# Patient Record
Sex: Male | Born: 1975
Health system: Southern US, Community
[De-identification: ages and names within clinical notes are randomized; demographics above are authoritative.]

## PROBLEM LIST (undated history)

## (undated) DIAGNOSIS — N200 Calculus of kidney: Secondary | ICD-10-CM

## (undated) DIAGNOSIS — E785 Hyperlipidemia, unspecified: Secondary | ICD-10-CM

## (undated) DIAGNOSIS — Z720 Tobacco use: Secondary | ICD-10-CM

## (undated) DIAGNOSIS — E118 Type 2 diabetes mellitus with unspecified complications: Secondary | ICD-10-CM

## (undated) DIAGNOSIS — I25110 Atherosclerotic heart disease of native coronary artery with unstable angina pectoris: Secondary | ICD-10-CM

## (undated) DIAGNOSIS — I2119 ST elevation (STEMI) myocardial infarction involving other coronary artery of inferior wall: Secondary | ICD-10-CM

## (undated) DIAGNOSIS — E1169 Type 2 diabetes mellitus with other specified complication: Secondary | ICD-10-CM

## (undated) HISTORY — DX: Calculus of kidney: N20.0

## (undated) HISTORY — DX: Hyperlipidemia, unspecified: E78.5

## (undated) HISTORY — DX: Morbid (severe) obesity due to excess calories: E66.01

## (undated) HISTORY — PX: WRIST SURGERY: SHX841

## (undated) HISTORY — DX: Type 2 diabetes mellitus with unspecified complications: E11.8

## (undated) HISTORY — DX: Hyperlipidemia, unspecified: E11.69

---
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
## 2001-01-17 ENCOUNTER — Emergency Department (HOSPITAL_COMMUNITY): Admission: EM | Admit: 2001-01-17 | Discharge: 2001-01-17 | Payer: Self-pay

## 2008-09-22 ENCOUNTER — Encounter: Admission: RE | Admit: 2008-09-22 | Discharge: 2008-09-22 | Payer: Self-pay | Admitting: Orthopedic Surgery

## 2010-02-06 ENCOUNTER — Emergency Department (HOSPITAL_COMMUNITY): Admission: EM | Admit: 2010-02-06 | Discharge: 2010-02-06 | Payer: Self-pay | Admitting: Emergency Medicine

## 2010-05-12 ENCOUNTER — Emergency Department (HOSPITAL_COMMUNITY)
Admission: EM | Admit: 2010-05-12 | Discharge: 2010-05-12 | Payer: Self-pay | Source: Home / Self Care | Admitting: Emergency Medicine

## 2010-08-06 ENCOUNTER — Inpatient Hospital Stay (HOSPITAL_COMMUNITY)
Admission: EM | Admit: 2010-08-06 | Discharge: 2010-08-10 | Payer: Self-pay | Source: Home / Self Care | Attending: Internal Medicine | Admitting: Internal Medicine

## 2010-08-27 ENCOUNTER — Emergency Department (HOSPITAL_COMMUNITY)
Admission: EM | Admit: 2010-08-27 | Discharge: 2010-08-27 | Payer: Self-pay | Source: Home / Self Care | Admitting: Emergency Medicine

## 2010-11-04 LAB — BASIC METABOLIC PANEL
BUN: 11 mg/dL (ref 6–23)
CO2: 30 mEq/L (ref 19–32)
Calcium: 9 mg/dL (ref 8.4–10.5)
Chloride: 103 mEq/L (ref 96–112)
Creatinine, Ser: 0.93 mg/dL (ref 0.4–1.5)
GFR calc Af Amer: >60 mL/min (ref >60)
GFR calc non Af Amer: >60 mL/min (ref >60)
Glucose, Bld: 97 mg/dL (ref 70–99)
Potassium: 4.5 mEq/L (ref 3.5–5.1)
Sodium: 138 mEq/L (ref 135–145)

## 2010-11-04 LAB — DIFFERENTIAL
Basophils Absolute: 0 10*3/uL (ref 0.0–0.1)
Basophils Relative: 0 % (ref 0–1)
Eosinophils Absolute: 0.1 10*3/uL (ref 0.0–0.7)
Eosinophils Relative: 1 % (ref 0–5)
Lymphocytes Relative: 14 % (ref 12–46)
Lymphs Abs: 1.7 10*3/uL (ref 0.7–4.0)
Monocytes Absolute: 0.9 10*3/uL (ref 0.1–1.0)
Monocytes Relative: 8 % (ref 3–12)
Neutro Abs: 9.4 10*3/uL — ABNORMAL HIGH (ref 1.7–7.7)
Neutrophils Relative %: 78 % — ABNORMAL HIGH (ref 43–77)

## 2010-11-04 LAB — CBC
HCT: 45 % (ref 39.0–52.0)
HCT: 49.7 % (ref 39.0–52.0)
Hemoglobin: 15.1 g/dL (ref 13.0–17.0)
Hemoglobin: 17.4 g/dL — ABNORMAL HIGH (ref 13.0–17.0)
MCH: 31.5 pg (ref 26.0–34.0)
MCH: 32.2 pg (ref 26.0–34.0)
MCHC: 33.6 g/dL (ref 30.0–36.0)
MCHC: 35 g/dL (ref 30.0–36.0)
MCV: 91.9 fL (ref 78.0–100.0)
MCV: 93.9 fL (ref 78.0–100.0)
Platelets: 210 10*3/uL (ref 150–400)
Platelets: 216 10*3/uL (ref 150–400)
RBC: 4.79 MIL/uL (ref 4.22–5.81)
RBC: 5.41 MIL/uL (ref 4.22–5.81)
RDW: 12.3 % (ref 11.5–15.5)
RDW: 12.4 % (ref 11.5–15.5)
WBC: 12.1 10*3/uL — ABNORMAL HIGH (ref 4.0–10.5)
WBC: 9.7 10*3/uL (ref 4.0–10.5)

## 2010-11-04 LAB — POCT I-STAT, CHEM 8
BUN: 9 mg/dL (ref 6–23)
Calcium, Ion: 1.17 mmol/L (ref 1.12–1.32)
Chloride: 104 mEq/L (ref 96–112)
Creatinine, Ser: 0.9 mg/dL (ref 0.4–1.5)
Glucose, Bld: 105 mg/dL — ABNORMAL HIGH (ref 70–99)
HCT: 51 % (ref 39.0–52.0)
Hemoglobin: 17.3 g/dL — ABNORMAL HIGH (ref 13.0–17.0)
Potassium: 4.1 mEq/L (ref 3.5–5.1)
Sodium: 140 mEq/L (ref 135–145)
TCO2: 28 mmol/L (ref 0–100)

## 2010-11-04 LAB — GC/CHLAMYDIA PROBE AMP, GENITAL
Chlamydia, DNA Probe: NEGATIVE
GC Probe Amp, Genital: NEGATIVE

## 2012-05-27 ENCOUNTER — Emergency Department (HOSPITAL_COMMUNITY)
Admission: EM | Admit: 2012-05-27 | Discharge: 2012-05-27 | Disposition: A | Discharge status: Home / Self Care | Payer: Self-pay | Attending: Emergency Medicine | Admitting: Emergency Medicine

## 2012-05-27 ENCOUNTER — Encounter (HOSPITAL_COMMUNITY): Payer: Self-pay | Admitting: Emergency Medicine

## 2012-05-27 DIAGNOSIS — F17200 Nicotine dependence, unspecified, uncomplicated: Secondary | ICD-10-CM | POA: Insufficient documentation

## 2012-05-27 DIAGNOSIS — H16001 Unspecified corneal ulcer, right eye: Secondary | ICD-10-CM

## 2012-05-27 DIAGNOSIS — H16009 Unspecified corneal ulcer, unspecified eye: Secondary | ICD-10-CM | POA: Insufficient documentation

## 2012-05-27 MED ORDER — OXYCODONE-ACETAMINOPHEN 5-325 MG PO TABS: 1.0000 | ORAL_TABLET | ORAL | Status: DC | PRN

## 2012-05-27 MED ORDER — FLUORESCEIN SODIUM 1 MG OP STRP
1.0000 | ORAL_STRIP | Freq: Once | OPHTHALMIC | Status: AC
  Administered 2012-05-27: 1 via OPHTHALMIC
  Filled 2012-05-27: qty 1

## 2012-05-27 MED ORDER — CIPROFLOXACIN HCL 0.3 % OP SOLN: 1.0000 [drp] | OPHTHALMIC | Status: DC

## 2012-05-27 NOTE — ED Notes (Signed)
Pt presents to department with right eye pain, swelling present on the eyelid, redness in the lateral left corner of right eye also.  Pt. States doesn't remember if present last night.  Burning present pt. States.  Pt. Stable at this time.

## 2012-05-27 NOTE — ED Provider Notes (Signed)
History  Scribed for Raeford Razor, MD, the patient was seen in room TR02C/TR02C. This chart was scribed by Candelaria Stagers. The patient's care started at 6:12 PM   CSN: [id number]  Arrival date & time 05/27/12  1744   First MD Initiated Contact with Patient 05/27/12 1811      Chief Complaint  Patient presents with  . Eye Pain     The history is provided by the patient. No language interpreter was used.   David Bender is a 36 y.o. male who presents to the Emergency Department complaining of right eye pain that started this morning.  Pt wears contacts and states that he changes them every month.  He is currently wearing his contacts and reports that he changed them about two weeks ago.  He describes the pain as 6/10.  Movement makes the pain worse.  He is experiencing photophobia, drainage, and vision changes.  He denies any recent injury or debris in the eye.    History reviewed. No pertinent past medical history.  Past Surgical History  Procedure Date  . Wrist surgery     History reviewed. No pertinent family history.  History  Substance Use Topics  . Smoking status: Current Every Day Smoker -- 1.5 packs/day for 10 years    Types: Cigarettes  . Smokeless tobacco: Not on file  . Alcohol Use: No      Review of Systems  Eyes: Positive for photophobia, pain (right eye) and visual disturbance. Negative for discharge.  All other systems reviewed and are negative.    Allergies  Review of patient's allergies indicates no known allergies.  Home Medications  No current outpatient prescriptions on file.  BP 135/90  Pulse 101  Resp 18  SpO2 98%  Physical Exam  Nursing note and vitals reviewed. Constitutional: He is oriented to person, place, and time. He appears well-developed and well-nourished. No distress.  HENT:  Head: Normocephalic and atraumatic.  Eyes: EOM are normal.       Small corneal ulcer proximally to 6-7 o'clock position.  Conjunctiva is injected.   Mild edema of upper lid.  No proptosis.     Neck: Neck supple. No tracheal deviation present.  Pulmonary/Chest: Effort normal. No respiratory distress.  Musculoskeletal: Normal range of motion. He exhibits no edema.  Neurological: He is alert and oriented to person, place, and time.  Skin: Skin is warm and dry.  Psychiatric: He has a normal mood and affect. His behavior is normal.    ED Course  Procedures   DIAGNOSTIC STUDIES: Oxygen Saturation is 98% on room air, normal by my interpretation.    COORDINATION OF CARE:     Labs Reviewed - No data to display No results found.   1. Corneal ulcer of right eye       MDM  36 year old male with right eye pain. Exam is consistent with a corneal ulcer. Patient does wear contact lenses. Patient was placed on antibiotic drops. Patient was instructed that he should not wear his contact lenses at all until he is evaluated by ophthalmology. Referral was provided. When necessary pain medication.  I personally preformed the services scribed in my presence. The recorded information has been reviewed and considered. Raeford Razor, MD.      Raeford Razor, MD 05/31/12 [phone]

## 2012-05-29 IMAGING — CT CT MAXILLOFACIAL W/ CM
3 series · 16 of 47 positions shown, 19 images · IV contrast (omnipaque)
Comparison: None.

CLINICAL DATA: Tooth infection and facial swelling.  Left orbital
and facial swelling times 1 day.

CT MAXILLOFACIAL WITH CONTRAST
TECHNIQUE: Multidetector CT imaging of the maxillofacial
structures was performed with intravenous contrast. Multiplanar CT
image reconstructions were also generated.
Contrast: 80 ml Omnipaque 300

[Series 3: orbit/facial 2.0 h30s · axial · 0.37mm/px · z∈[-261,-109]mm · 10 of 90 slices shown, 13 images]
[im 7/90  brain]
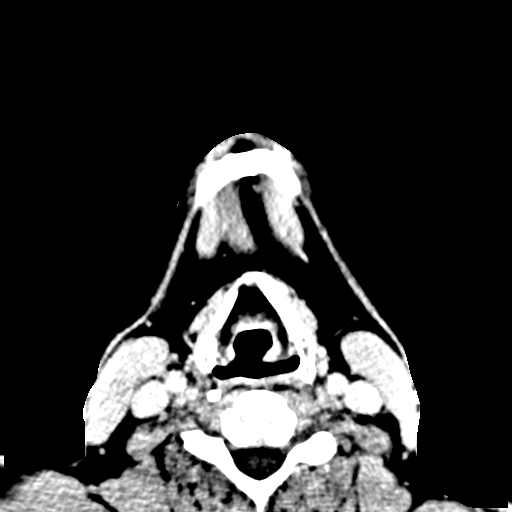
[im 7/90  bone]
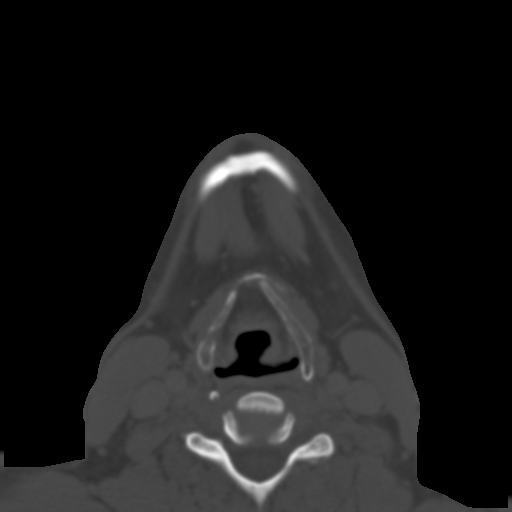
[im 16/90  bone]
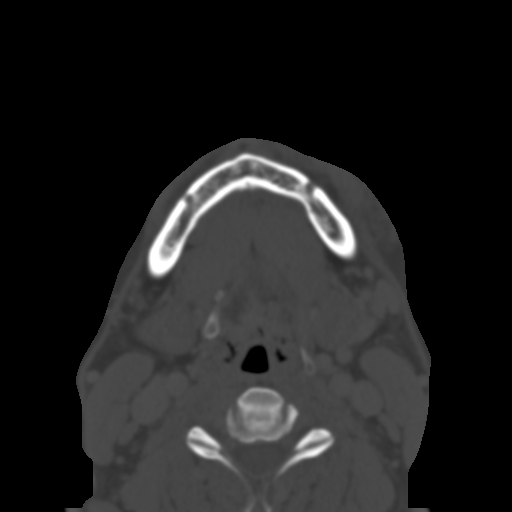
[im 25/90  bone]
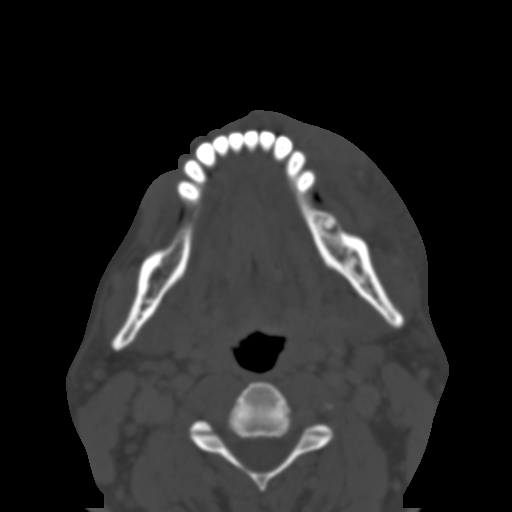
[im 31/90  bone]
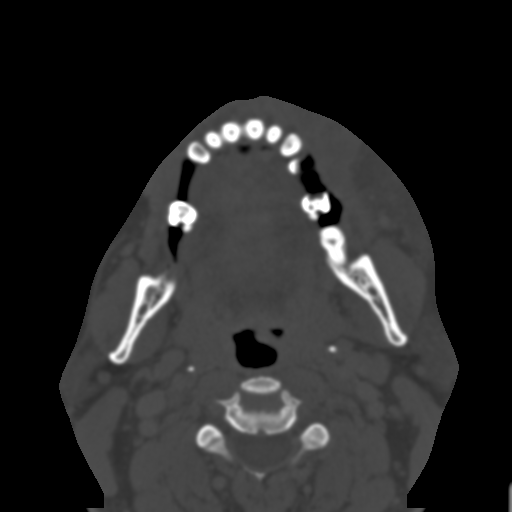
[im 40/90  brain]
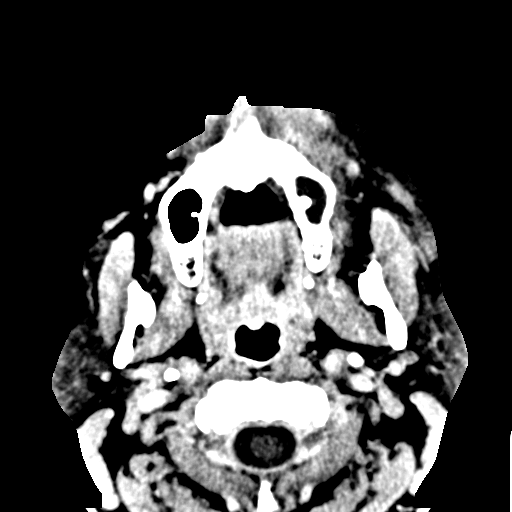
[im 40/90  bone]
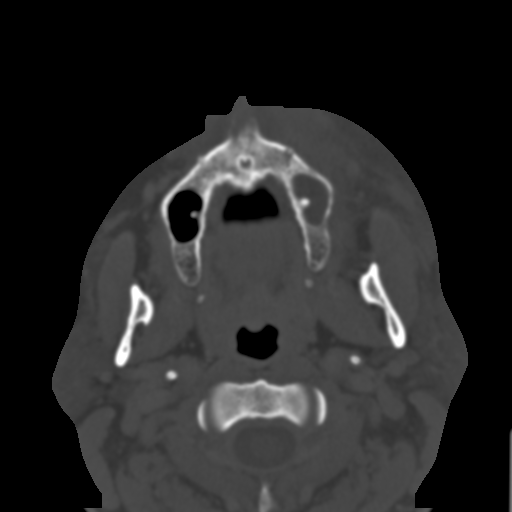
[im 50/90  bone]
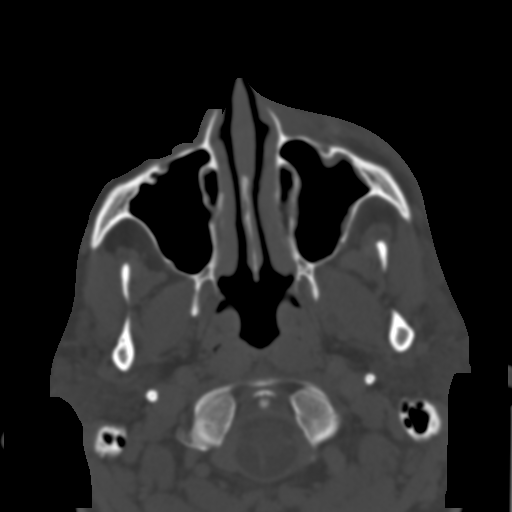
[im 59/90  bone]
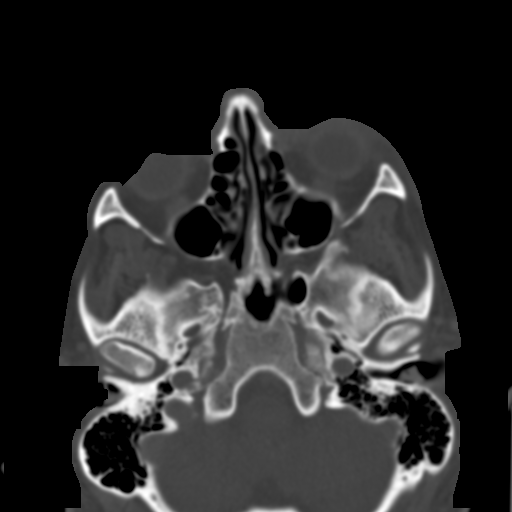
[im 68/90  bone]
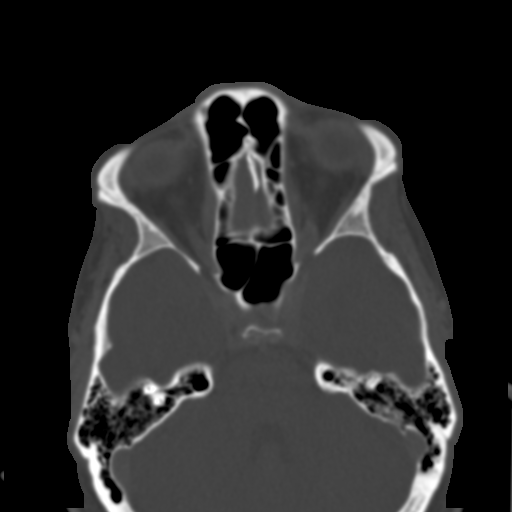
[im 74/90  brain]
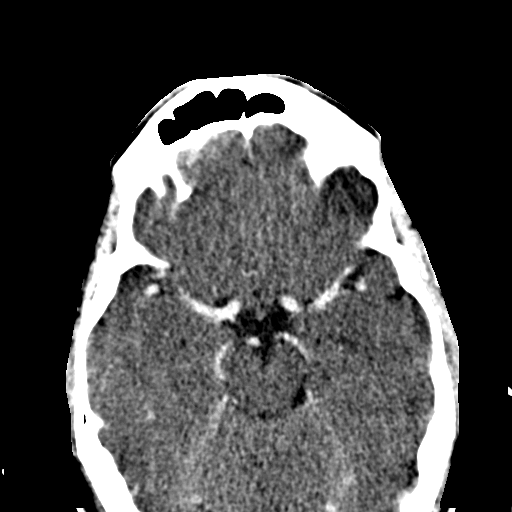
[im 74/90  bone]
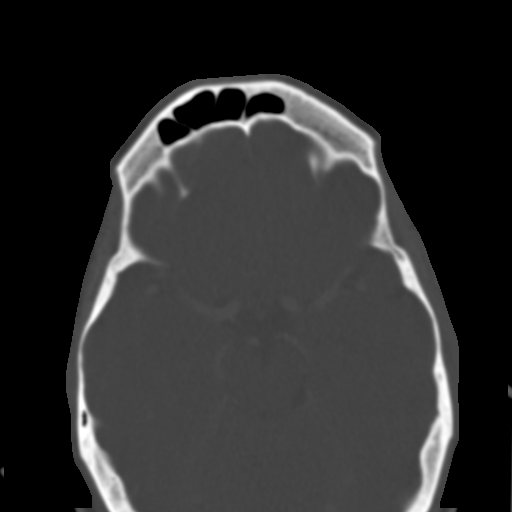
[im 83/90  bone]
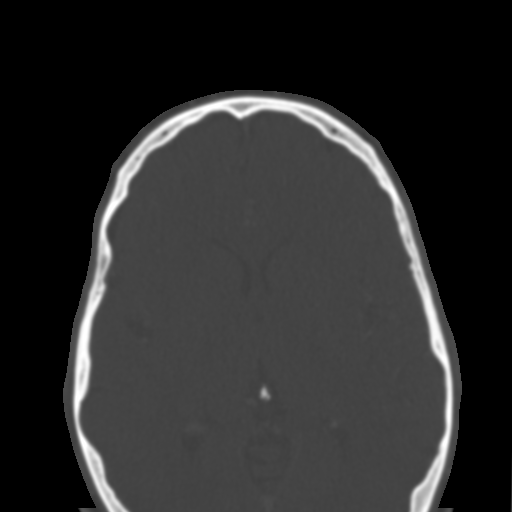

[Series 9: orbit/facial 2.0 spo · coronal · 0.39mm/px · 3 of 74 slices shown (1 of 2)]
[im 25/74  bone]
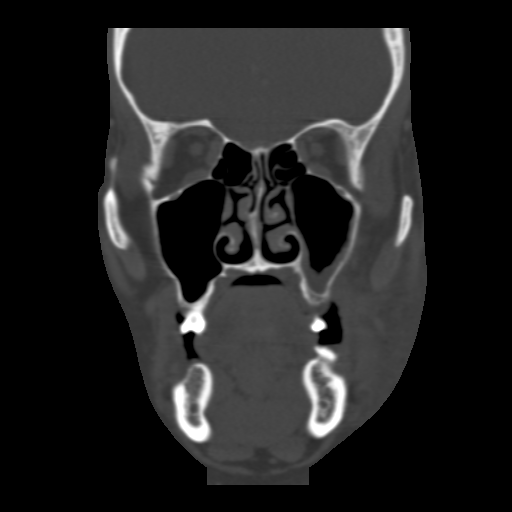
[im 33/74  bone]
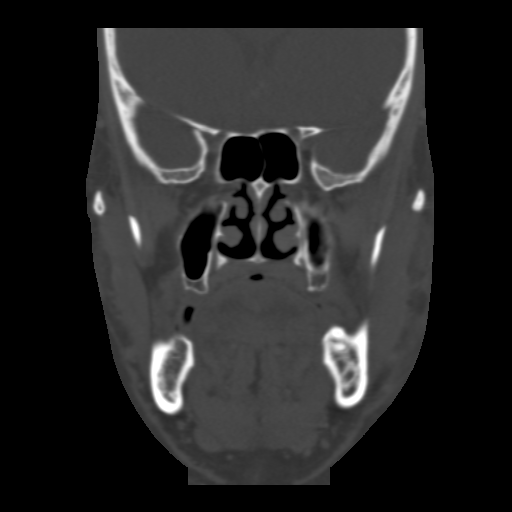
[im 41/74  bone]
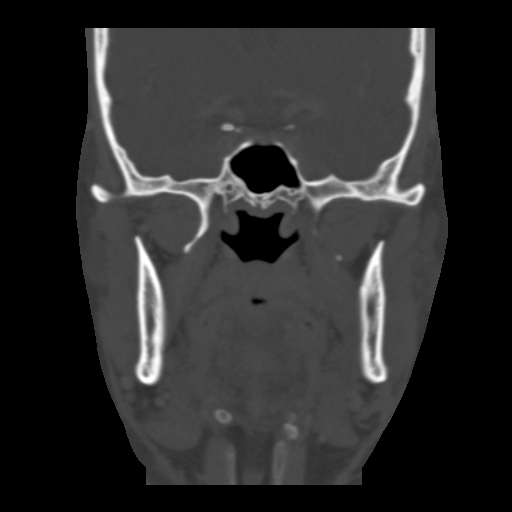

[Series 10: orbit/facial 2.0 spo · sagittal · 0.39mm/px · 3 of 79 slices shown (2 of 2)]
[im 27/79  bone]
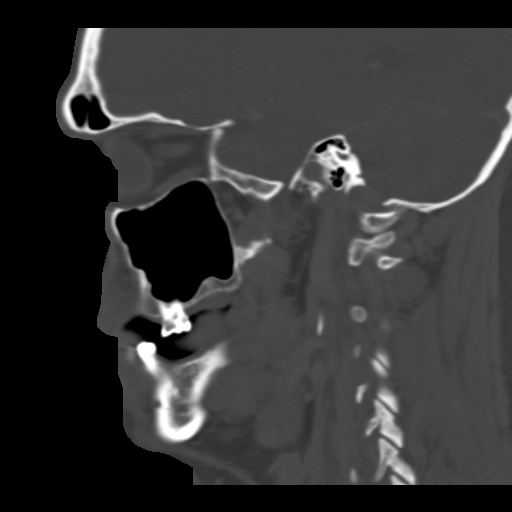
[im 40/79  bone]
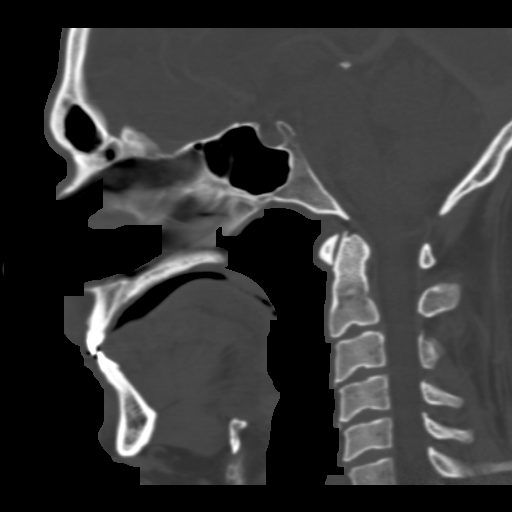
[im 53/79  bone]
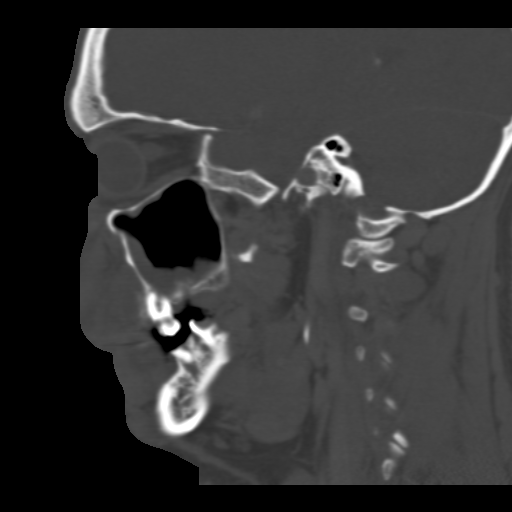

[16 of 47 positions shown; findings below may reference images not displayed]

FINDINGS: Multiple dental caries are present in essentially every
residual tooth of the maxilla.  No significant periapical disease
is evident.  A large dental caries involves the left first
mandibular molar.  A periapical lucency is noted with a defect in
the lateral cortex.  There is a large dental caries in the second
left mandibular molar as well without a significant periapical
collection.  The right-sided mandibular molars have been extracted.

Extensive inflammatory changes are centered about the left
mandibular molar.  These extend within the subcutaneous tissues of
the left side the face.  Extensive left periorbital soft tissue
swelling is present as well.  A discrete abscess is not evident.
No post-septal inflammatory changes are present within the left
orbit.  Mild mucosal thickening is present at the floor of the left
maxillary sinus.  The mandible is otherwise intact and located.
Mild degenerative changes present within the upper cervical spine.
The
IMPRESSION: 1.  Large dental caries and periapical abscess involving the left
first mandibular molar.
2.  Lateral cortical defect at the level of the periapical abscess
without a significant soft tissue or subperiosteal abscess
associated.
3.  Extensive inflammatory changes within the adjacent soft tissues
and spreading throughout the subcutaneous tissues to the level of
the left orbit.
4.  Left periorbital cellulitis without significant post septal
involvement.
5.  Extensive dental caries involving nearly every tooth of the
maxilla.

## 2012-09-15 ENCOUNTER — Encounter (HOSPITAL_COMMUNITY): Payer: Self-pay | Admitting: *Deleted

## 2012-09-15 ENCOUNTER — Emergency Department (HOSPITAL_COMMUNITY): Payer: No Typology Code available for payment source

## 2012-09-15 ENCOUNTER — Emergency Department (HOSPITAL_COMMUNITY)
Admission: EM | Admit: 2012-09-15 | Discharge: 2012-09-15 | Disposition: A | Discharge status: Home / Self Care | Payer: No Typology Code available for payment source | Attending: Emergency Medicine | Admitting: Emergency Medicine

## 2012-09-15 DIAGNOSIS — Y92410 Unspecified street and highway as the place of occurrence of the external cause: Secondary | ICD-10-CM | POA: Insufficient documentation

## 2012-09-15 DIAGNOSIS — S0993XA Unspecified injury of face, initial encounter: Secondary | ICD-10-CM | POA: Insufficient documentation

## 2012-09-15 DIAGNOSIS — M549 Dorsalgia, unspecified: Secondary | ICD-10-CM

## 2012-09-15 DIAGNOSIS — S161XXA Strain of muscle, fascia and tendon at neck level, initial encounter: Secondary | ICD-10-CM

## 2012-09-15 DIAGNOSIS — S139XXA Sprain of joints and ligaments of unspecified parts of neck, initial encounter: Secondary | ICD-10-CM | POA: Insufficient documentation

## 2012-09-15 DIAGNOSIS — S199XXA Unspecified injury of neck, initial encounter: Secondary | ICD-10-CM | POA: Insufficient documentation

## 2012-09-15 DIAGNOSIS — IMO0002 Reserved for concepts with insufficient information to code with codable children: Secondary | ICD-10-CM | POA: Insufficient documentation

## 2012-09-15 DIAGNOSIS — Z7982 Long term (current) use of aspirin: Secondary | ICD-10-CM | POA: Insufficient documentation

## 2012-09-15 DIAGNOSIS — Y9389 Activity, other specified: Secondary | ICD-10-CM | POA: Insufficient documentation

## 2012-09-15 DIAGNOSIS — F17200 Nicotine dependence, unspecified, uncomplicated: Secondary | ICD-10-CM | POA: Insufficient documentation

## 2012-09-15 MED ORDER — NAPROXEN 500 MG PO TABS: 500.0000 mg | ORAL_TABLET | Freq: Two times a day (BID) | ORAL | Status: DC | PRN

## 2012-09-15 MED ORDER — METHOCARBAMOL 750 MG PO TABS: 750.0000 mg | ORAL_TABLET | Freq: Four times a day (QID) | ORAL | Status: DC | PRN

## 2012-09-15 MED ORDER — HYDROCODONE-ACETAMINOPHEN 5-325 MG PO TABS: 1.0000 | ORAL_TABLET | Freq: Four times a day (QID) | ORAL | Status: DC | PRN
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
## 2012-09-16 NOTE — ED Provider Notes (Signed)
Medical screening examination/treatment/procedure(s) were performed by non-physician practitioner and as supervising physician I was immediately available for consultation/collaboration.   [name], DO 09/16/12 1601

## 2016-09-19 ENCOUNTER — Encounter (HOSPITAL_COMMUNITY): Payer: Self-pay | Admitting: *Deleted

## 2016-09-19 ENCOUNTER — Emergency Department (HOSPITAL_COMMUNITY)
Admission: EM | Admit: 2016-09-19 | Discharge: 2016-09-19 | Disposition: A | Discharge status: Home / Self Care | Payer: Self-pay | Attending: Emergency Medicine | Admitting: Emergency Medicine

## 2016-09-19 DIAGNOSIS — Z79899 Other long term (current) drug therapy: Secondary | ICD-10-CM | POA: Insufficient documentation

## 2016-09-19 DIAGNOSIS — F17210 Nicotine dependence, cigarettes, uncomplicated: Secondary | ICD-10-CM | POA: Insufficient documentation

## 2016-09-19 DIAGNOSIS — K0889 Other specified disorders of teeth and supporting structures: Secondary | ICD-10-CM | POA: Insufficient documentation

## 2016-09-19 DIAGNOSIS — Z7982 Long term (current) use of aspirin: Secondary | ICD-10-CM | POA: Insufficient documentation

## 2016-09-19 MED ORDER — IBUPROFEN 800 MG PO TABS: 800.0000 mg | ORAL_TABLET | Freq: Three times a day (TID) | ORAL | 0 refills | Status: DC | PRN

## 2016-09-19 MED ORDER — PENICILLIN V POTASSIUM 500 MG PO TABS
500.0000 mg | ORAL_TABLET | Freq: Four times a day (QID) | ORAL | 0 refills | Status: DC
Start: 2016-09-19 — End: 2019-05-28

## 2016-09-19 NOTE — Discharge Instructions (Signed)
Read the information below.  Use the prescribed medication as directed.  Please discuss all new medications with your pharmacist.  You may return to the Emergency Department at any time for worsening condition or any new symptoms that concern you.   If you develop fevers, swelling in your face, difficulty swallowing or breathing, return to the ER immediately for a recheck.   °

## 2016-09-19 NOTE — ED Provider Notes (Signed)
[facility]-EMERGENCY DEPT Provider Note   CSN: [id number] Arrival date & time: 09/19/16  1617   By signing my name below, I, Soijett Blue, attest that this documentation has been prepared under the direction and in the presence of Trixie Dredge, PA-C Electronically Signed: Soijett Blue, ED Scribe. 09/19/16. 5:17 PM.  History   Chief Complaint Chief Complaint  Patient presents with  . Dental Pain    HPI David Bender is a 41 y.o. male who presents to the Emergency Department complaining of left lower dental pain radiating to jaw onset yesterday. He is having associated symptoms of cough and post-tussive emesis. He has tried [facility] powder with no relief of his symptoms. He denies sore throat, fever, chills, trouble swallowing, SOB, abdominal pain, and any other symptoms. Pt denies allergies to any medications. Pt has sick contacts of his significant other.     The history is provided by the patient. No language interpreter was used.    History reviewed. No pertinent past medical history.  There are no active problems to display for this patient.   Past Surgical History:  Procedure Laterality Date  . WRIST SURGERY         Home Medications    Prior to Admission medications   Medication Sig Start Date End Date Taking? Authorizing Provider  Aspirin-Acetaminophen-Caffeine (GOODY HEADACHE PO) Take 1 tablet by mouth daily as needed. For headache    Historical Provider, MD  HYDROcodone-acetaminophen (NORCO/VICODIN) 5-325 MG per tablet Take 1 tablet by mouth every 6 (six) hours as needed for pain (Take 1 - 2 tablets every 4 - 6 hours.). 09/15/12   Hannah Muthersbaugh, PA-C  ibuprofen (ADVIL,MOTRIN) 800 MG tablet Take 1 tablet (800 mg total) by mouth every 8 (eight) hours as needed for mild pain or moderate pain. 09/19/16   Trixie Dredge, PA-C  methocarbamol (ROBAXIN) 750 MG tablet Take 1 tablet (750 mg total) by mouth 4 (four) times daily as needed (Take 1 tablet every 6 hours as needed for muscle  spasms.). 09/15/12   Hannah Muthersbaugh, PA-C  naproxen (NAPROSYN) 500 MG tablet Take 1 tablet (500 mg total) by mouth 2 (two) times daily as needed. 09/15/12   Hannah Muthersbaugh, PA-C  penicillin v potassium (VEETID) 500 MG tablet Take 1 tablet (500 mg total) by mouth 4 (four) times daily. 09/19/16   Trixie Dredge, PA-C    Family History No family history on file.  Social History Social History  Substance Use Topics  . Smoking status: Current Every Day Smoker    Packs/day: 1.50    Years: 10.00    Types: Cigarettes  . Smokeless tobacco: Never Used  . Alcohol use No     Allergies   Patient has no known allergies.   Review of Systems Review of Systems  Constitutional: Negative for chills and fever.  HENT: Positive for dental problem (left lower). Negative for sore throat and trouble swallowing.   Respiratory: Positive for cough. Negative for shortness of breath, wheezing and stridor.   Gastrointestinal: Positive for vomiting (post-tussive). Negative for abdominal pain.  Musculoskeletal: Negative for neck pain and neck stiffness.  Allergic/Immunologic: Negative for immunocompromised state.    Physical Exam Updated Vital Signs BP 157/92 (BP Location: Left Arm)   Pulse 105   Temp 99.4 F (37.4 C) (Oral)   Resp 20   SpO2 100%   Physical Exam  Constitutional: He appears well-developed and well-nourished. No distress.  HENT:  Head: Normocephalic and atraumatic.  Mouth/Throat: Uvula is midline and oropharynx  is clear and moist. Mucous membranes are not dry. Abnormal dentition. No uvula swelling. No oropharyngeal exudate, posterior oropharyngeal edema, posterior oropharyngeal erythema or tonsillar abscesses.  Few remaining teeth. Left lower dentition with severe decay. TTP in the gingiva adjacent to second bicuspid.   Neck: Normal range of motion. Neck supple.  Cardiovascular: Normal rate, regular rhythm and normal heart sounds.  Exam reveals no gallop and no friction rub.   No  murmur heard. Pulmonary/Chest: Effort normal and breath sounds normal. No stridor. No respiratory distress. He has no wheezes. He has no rales.  Abdominal: Soft. He exhibits no distension and no mass. There is no tenderness. There is no rebound and no guarding.  Lymphadenopathy:    He has no cervical adenopathy.  Neurological: He is alert. He exhibits normal muscle tone.  Skin: He is not diaphoretic.  Nursing note and vitals reviewed.    ED Treatments / Results  DIAGNOSTIC STUDIES: Oxygen Saturation is 100% on RA, nl by my interpretation.    COORDINATION OF CARE: 5:14 PM Discussed treatment plan with pt at bedside which includes referral and follow up with dentist, veetid Rx, ibuprofen Rx, and pt agreed to plan.   Procedures Procedures (including critical care time)  Medications Ordered in ED Medications - No data to display   Initial Impression / Assessment and Plan / ED Course  I have reviewed the triage vital signs and the nursing notes.    Patient with dentalgia. No abscess requiring immediate incision and drainage.  Exam not concerning for Ludwig's angina or pharyngeal abscess. Will treat with Penicillin Rx and ibuprofen Rx. Pt instructed to follow-up with dentist.  Discussed return precautions. Pt safe for discharge.  Discussed result, findings, treatment, and follow up  with patient.  Pt given return precautions.  Pt verbalizes understanding and agrees with plan.      Final Clinical Impressions(s) / ED Diagnoses   Final diagnoses:  Pain, dental    New Prescriptions Discharge Medication List as of 09/19/2016  5:16 PM    START taking these medications   Details  ibuprofen (ADVIL,MOTRIN) 800 MG tablet Take 1 tablet (800 mg total) by mouth every 8 (eight) hours as needed for mild pain or moderate pain., Starting Fri 09/19/2016, Print    penicillin v potassium (VEETID) 500 MG tablet Take 1 tablet (500 mg total) by mouth 4 (four) times daily., Starting Fri 09/19/2016,  Print       I personally performed the services described in this documentation, which was scribed in my presence. The recorded information has been reviewed and is accurate.     Trixie Dredgemily Alexx Mcburney, PA-C 09/19/16 1738    Rolland PorterMark James, MD 09/25/16 [phone]

## 2016-09-19 NOTE — ED Triage Notes (Signed)
Pt reports dental pain since yesterday.  Pt reports pain in lower mid-left tooth.  Pt has several missing teeth in bath of mouth and an black area where pt reports pain.

## 2017-04-10 ENCOUNTER — Emergency Department (HOSPITAL_COMMUNITY)
Admission: EM | Admit: 2017-04-10 | Discharge: 2017-04-10 | Disposition: A | Discharge status: Home / Self Care | Payer: Self-pay | Attending: Emergency Medicine | Admitting: Emergency Medicine

## 2017-04-10 ENCOUNTER — Encounter (HOSPITAL_COMMUNITY): Payer: Self-pay | Admitting: Emergency Medicine

## 2017-04-10 DIAGNOSIS — H18823 Corneal disorder due to contact lens, bilateral: Secondary | ICD-10-CM

## 2017-04-10 DIAGNOSIS — H1031 Unspecified acute conjunctivitis, right eye: Secondary | ICD-10-CM

## 2017-04-10 DIAGNOSIS — F17210 Nicotine dependence, cigarettes, uncomplicated: Secondary | ICD-10-CM | POA: Insufficient documentation

## 2017-04-10 MED ORDER — PROPARACAINE HCL 0.5 % OP SOLN
1.0000 [drp] | Freq: Once | OPHTHALMIC | Status: AC
  Administered 2017-04-10: 1 [drp] via OPHTHALMIC
  Filled 2017-04-10: qty 15

## 2017-04-10 MED ORDER — OFLOXACIN 0.3 % OP SOLN: 1.0000 [drp] | Freq: Four times a day (QID) | OPHTHALMIC | 0 refills | Status: DC

## 2017-04-10 MED ORDER — FLUORESCEIN SODIUM 0.6 MG OP STRP
1.0000 | ORAL_STRIP | Freq: Once | OPHTHALMIC | Status: AC
  Administered 2017-04-10: 1 via OPHTHALMIC
  Filled 2017-04-10: qty 1

## 2017-04-10 MED ORDER — CLINDAMYCIN HCL 150 MG PO CAPS: 450.0000 mg | ORAL_CAPSULE | Freq: Three times a day (TID) | ORAL | 0 refills | Status: DC

## 2017-04-10 NOTE — Discharge Instructions (Signed)
°  Follow with the eye doctor in the next 24 to 28 hours  Do not reuse your contact lenses and do not use any contact lenses until you are cleared by the eye doctor.   Wash your hands frequently and try to keep your hands away from the affected eye(s).   You should be feeling some improvement by 48 hours. If symptoms worsen, you develop pain, change in your vision or no improvement in 48 hours please follow with the ophthalmologist or, if that is not possible, return to the emergency room for a recheck.

## 2017-04-10 NOTE — ED Notes (Signed)
Proparacaine and fluoresceine strips at bedside.

## 2017-04-10 NOTE — ED Notes (Signed)
Visual acuity screening: Left eye: 20/15 (patient wearing prescription contact lens) Right eye: 20/100 (no contacts, denies prescription) Bilateral eyes: 20/15

## 2017-04-10 NOTE — ED Triage Notes (Signed)
Pt states that he has had rt sided eye irritation x 2 days.  Pt states that he leaves his contacts in for 11 months at a time before taking them out.  Pt states that he last put new ones in about a week ago and irritation started a few days later.

## 2017-04-10 NOTE — ED Provider Notes (Signed)
[facility]-EMERGENCY DEPT Provider Note   CSN: [id number] Arrival date & time: 04/10/17  1445     History   Chief Complaint Chief Complaint  Patient presents with  . Eye Pain   HPI   Blood pressure (!) 143/100, pulse 90, temperature 98.6 F (37 C), temperature source Oral, resp. rate 16, SpO2 100 %.  David Bender is a 41 y.o. male complaining of Right eye irritation, redness, discharge and foreign body sensation onset 2 days ago. He is a contact lens wear and because he has difficulty removing and inserting his contacts she's been wearing the same contacts for 11 months. He is taken the contact lenses out. He reports typical blurred vision of uncorrected site but nothing abnormal. He states that the eye is crusted in the a.m. He has swelling on the right eyelid which he states started after the injection and discharge.  History reviewed. No pertinent past medical history.  There are no active problems to display for this patient.   Past Surgical History:  Procedure Laterality Date  . WRIST SURGERY         Home Medications    Prior to Admission medications   Medication Sig Start Date End Date Taking? Authorizing Provider  Aspirin-Acetaminophen-Caffeine (GOODY HEADACHE PO) Take 1 tablet by mouth daily as needed. For headache    [provider]  clindamycin (CLEOCIN) 150 MG capsule Take 3 capsules (450 mg total) by mouth 3 (three) times daily. 04/10/17   Dreamer Carillo, Joni Reining, PA-C  HYDROcodone-acetaminophen (NORCO/VICODIN) 5-325 MG per tablet Take 1 tablet by mouth every 6 (six) hours as needed for pain (Take 1 - 2 tablets every 4 - 6 hours.). 09/15/12   Muthersbaugh, Dahlia Client, PA-C  ibuprofen (ADVIL,MOTRIN) 800 MG tablet Take 1 tablet (800 mg total) by mouth every 8 (eight) hours as needed for mild pain or moderate pain. 09/19/16   Trixie Dredge, PA-C  methocarbamol (ROBAXIN) 750 MG tablet Take 1 tablet (750 mg total) by mouth 4 (four) times daily as needed (Take 1 tablet every 6  hours as needed for muscle spasms.). 09/15/12   Muthersbaugh, Dahlia Client, PA-C  naproxen (NAPROSYN) 500 MG tablet Take 1 tablet (500 mg total) by mouth 2 (two) times daily as needed. 09/15/12   Muthersbaugh, Dahlia Client, PA-C  ofloxacin (OCUFLOX) 0.3 % ophthalmic solution Place 1 drop into the right eye 4 (four) times daily. 04/10/17   Jedrick Hutcherson, Joni Reining, PA-C  penicillin v potassium (VEETID) 500 MG tablet Take 1 tablet (500 mg total) by mouth 4 (four) times daily. 09/19/16   Trixie Dredge, PA-C    Family History No family history on file.  Social History Social History  Substance Use Topics  . Smoking status: Current Every Day Smoker    Packs/day: 1.50    Years: 10.00    Types: Cigarettes  . Smokeless tobacco: Never Used  . Alcohol use No     Allergies   Patient has no known allergies.   Review of Systems Review of Systems  A complete review of systems was obtained and all systems are negative except as noted in the HPI and PMH.   Physical Exam Updated Vital Signs BP (!) 143/100 (BP Location: Left Arm)   Pulse 90   Temp 98.6 F (37 C) (Oral)   Resp 16   SpO2 100%   Physical Exam  Constitutional: He is oriented to person, place, and time. He appears well-developed and well-nourished. No distress.  HENT:  Head: Normocephalic and atraumatic.  Mouth/Throat: Oropharynx is clear  and moist.  Eyes: Pupils are equal, round, and reactive to light. EOM are normal.  Right eye injected, mild edema and erythema with no significant swelling to right upper and lower lid. No significant discharge from the right eye. Pupils equal and reactive. Extraocular movement is intact without pain or diplopia.  No abnormal uptake on fluorescein stain.  Neck: Normal range of motion.  Cardiovascular: Normal rate, regular rhythm and intact distal pulses.  Exam reveals no gallop and no friction rub.   No murmur heard. Pulmonary/Chest: Effort normal and breath sounds normal. No respiratory distress. He has no  wheezes. He has no rales. He exhibits no tenderness.  Abdominal: Soft. There is no tenderness.  Musculoskeletal: Normal range of motion.  Neurological: He is alert and oriented to person, place, and time.  Skin: He is not diaphoretic.  Psychiatric: He has a normal mood and affect.  Nursing note and vitals reviewed.    ED Treatments / Results  Labs (all labs ordered are listed, but only abnormal results are displayed) Labs Reviewed - No data to display  EKG  EKG Interpretation None       Radiology No results found.  Procedures Procedures (including critical care time)  Medications Ordered in ED Medications  proparacaine (ALCAINE) 0.5 % ophthalmic solution 1 drop (1 drop Both Eyes Given 04/10/17 1655)  fluorescein ophthalmic strip 1 strip (1 strip Both Eyes Given 04/10/17 1655)     Initial Impression / Assessment and Plan / ED Course  I have reviewed the triage vital signs and the nursing notes.  Pertinent labs & imaging results that were available during my care of the patient were reviewed by me and considered in my medical decision making (see chart for details).     Vitals:   04/10/17 1511  BP: (!) 143/100  Pulse: 90  Resp: 16  Temp: 98.6 F (37 C)  TempSrc: Oral  SpO2: 100%    Medications  proparacaine (ALCAINE) 0.5 % ophthalmic solution 1 drop (1 drop Both Eyes Given 04/10/17 1655)  fluorescein ophthalmic strip 1 strip (1 strip Both Eyes Given 04/10/17 1655)    David Bender is 41 y.o. male presenting with Right eye injection and right eyelid swelling onset 2 days ago, this patient inappropriately uses his contact lenses for an extended use of time. There is no corneal abrasion on fluorescein stain. I feel that the swelling to the eyes likely a reaction to him rubbing the eye rather than a preseptal cellulitis as there is no significant warmth and it occurred after the IV came injected and irritated however out of an abundance of caution I will start him on  clindamycin for a preseptal cellulitis. He understands that he must not wear his contact lenses until he is well healed and he also understands that it is very dangerous to wear single contact lenses for an extensive period of time.  Evaluation does not show pathology that would require ongoing emergent intervention or inpatient treatment. Pt is hemodynamically stable and mentating appropriately. Discussed findings and plan with patient/guardian, who agrees with care plan. All questions answered. Return precautions discussed and outpatient follow up given.      Final Clinical Impressions(s) / ED Diagnoses   Final diagnoses:  Acute conjunctivitis of right eye, unspecified acute conjunctivitis type  Contact lens overwear of both eyes    New Prescriptions New Prescriptions   CLINDAMYCIN (CLEOCIN) 150 MG CAPSULE    Take 3 capsules (450 mg total) by mouth 3 (three) times  daily.   OFLOXACIN (OCUFLOX) 0.3 % OPHTHALMIC SOLUTION    Place 1 drop into the right eye 4 (four) times daily.     Kaylyn Lim 04/10/17 1656    Azalia Bilis, MD 04/10/17 [phone]

## 2017-04-10 NOTE — ED Notes (Signed)
Discharge instructions reviewed with patient. Patient verbalizes understanding. VSS.   

## 2017-04-17 ENCOUNTER — Encounter (HOSPITAL_COMMUNITY): Payer: Self-pay | Admitting: *Deleted

## 2017-04-17 ENCOUNTER — Emergency Department (HOSPITAL_COMMUNITY)
Admission: EM | Admit: 2017-04-17 | Discharge: 2017-04-17 | Disposition: A | Discharge status: Home / Self Care | Payer: Self-pay | Attending: Emergency Medicine | Admitting: Emergency Medicine

## 2017-04-17 DIAGNOSIS — H1033 Unspecified acute conjunctivitis, bilateral: Secondary | ICD-10-CM

## 2017-04-17 DIAGNOSIS — H1013 Acute atopic conjunctivitis, bilateral: Secondary | ICD-10-CM | POA: Insufficient documentation

## 2017-04-17 DIAGNOSIS — Z79899 Other long term (current) drug therapy: Secondary | ICD-10-CM | POA: Insufficient documentation

## 2017-04-17 DIAGNOSIS — F17210 Nicotine dependence, cigarettes, uncomplicated: Secondary | ICD-10-CM | POA: Insufficient documentation

## 2017-04-17 MED ORDER — BESIFLOXACIN HCL 0.6 % OP SUSP: 1.0000 [drp] | OPHTHALMIC | 0 refills | Status: DC

## 2017-04-17 MED ORDER — FLUORESCEIN SODIUM 0.6 MG OP STRP
1.0000 | ORAL_STRIP | Freq: Once | OPHTHALMIC | Status: AC
  Administered 2017-04-17: 1 via OPHTHALMIC
  Filled 2017-04-17: qty 1

## 2017-04-17 MED ORDER — TETRACAINE HCL 0.5 % OP SOLN
2.0000 [drp] | Freq: Once | OPHTHALMIC | Status: AC
  Administered 2017-04-17: 2 [drp] via OPHTHALMIC
  Filled 2017-04-17: qty 4

## 2017-04-17 NOTE — Discharge Instructions (Signed)
Begin using the Besifloxacin/Besivance eyedrops, every 2 hours. If you are unable to obtain this prescription tonight, continue using the ofloxacin drops as prescribed. Reports to Dr. Margaretmary Eddy clinic, tomorrow morning at 8 AM. The clinic is closed tomorrow, however Dr. Sherryll Burger will be in the clinic waiting for you. When you arrive call his cell phone [phone], and he will let you in.

## 2017-04-17 NOTE — ED Provider Notes (Signed)
[facility] Provider Note   CSN: [id number] Arrival date & time: 04/17/17  1949     History   Chief Complaint Chief Complaint  Patient presents with  . Conjunctivitis    HPI David Bender is a 41 y.o. male presenting w subsequent visit for IV redness, irritation and discharge. Patient was seen on 04/10/2017, and diagnosed with bacterial conjunctivitis of the right eye. Patient was prescribed ofloxacin ophthalmic drops QID, as well as PO clindamycin TID to cover for a possible preseptal cellulitis. Patient states has been taking medications as prescribed. He states he stopped using the right contact since the ED visit. He reports on Monday his left eye began feeling irritated, with redness and foreign body sensation. At that time he removed the contact in his left eye and began using the drops in that eye as well. Today he presents with persistent bilateral eye redness, irritation, discharge. He states the left eye has foreign body sensation and burns when he opens his eye. Reports associated blurred vision. Denies headache, fever, chills, nausea, vomiting, or any other complaints today.  The history is provided by the patient.    History reviewed. No pertinent past medical history.  There are no active problems to display for this patient.   Past Surgical History:  Procedure Laterality Date  . WRIST SURGERY         Home Medications    Prior to Admission medications   Medication Sig Start Date End Date Taking? Authorizing Provider  Aspirin-Acetaminophen-Caffeine (GOODY HEADACHE PO) Take 1 tablet by mouth daily as needed. For headache    [provider]  Besifloxacin HCl (BESIVANCE) 0.6 % SUSP Apply 1 drop to eye every 2 (two) hours. 04/17/17   Russo, [place] N, PA-C  clindamycin (CLEOCIN) 150 MG capsule Take 3 capsules (450 mg total) by mouth 3 (three) times daily. 04/10/17   Pisciotta, Joni Reining, PA-C  HYDROcodone-acetaminophen (NORCO/VICODIN) 5-325 MG per tablet  Take 1 tablet by mouth every 6 (six) hours as needed for pain (Take 1 - 2 tablets every 4 - 6 hours.). 09/15/12   Muthersbaugh, Dahlia Client, PA-C  ibuprofen (ADVIL,MOTRIN) 800 MG tablet Take 1 tablet (800 mg total) by mouth every 8 (eight) hours as needed for mild pain or moderate pain. 09/19/16   Trixie Dredge, PA-C  methocarbamol (ROBAXIN) 750 MG tablet Take 1 tablet (750 mg total) by mouth 4 (four) times daily as needed (Take 1 tablet every 6 hours as needed for muscle spasms.). 09/15/12   Muthersbaugh, Dahlia Client, PA-C  naproxen (NAPROSYN) 500 MG tablet Take 1 tablet (500 mg total) by mouth 2 (two) times daily as needed. 09/15/12   Muthersbaugh, Dahlia Client, PA-C  ofloxacin (OCUFLOX) 0.3 % ophthalmic solution Place 1 drop into the right eye 4 (four) times daily. 04/10/17   Pisciotta, Joni Reining, PA-C  penicillin v potassium (VEETID) 500 MG tablet Take 1 tablet (500 mg total) by mouth 4 (four) times daily. 09/19/16   Trixie Dredge, PA-C    Family History No family history on file.  Social History Social History  Substance Use Topics  . Smoking status: Current Every Day Smoker    Packs/day: 1.50    Years: 10.00    Types: Cigarettes  . Smokeless tobacco: Never Used  . Alcohol use No     Allergies   Patient has no known allergies.   Review of Systems Review of Systems  Constitutional: Negative for chills and fever.  HENT: Negative for congestion and sore throat.   Eyes: Positive for  pain, discharge, redness, itching and visual disturbance.     Physical Exam Updated Vital Signs BP (!) 147/102 (BP Location: Left Arm)   Pulse 88   Temp 98.5 F (36.9 C) (Oral)   Resp 18   Ht 6\' 2"  (1.88 m)   Wt (!) 140.6 kg (310 lb)   SpO2 100%   BMI 39.80 kg/m   Physical Exam  Constitutional: He appears well-developed and well-nourished. No distress.  HENT:  Head: Normocephalic and atraumatic.  Mouth/Throat: Oropharynx is clear and moist.  Eyes: Pupils are equal, round, and reactive to light. EOM are normal.  Lids are everted and swept, no foreign bodies found. Right eye exhibits discharge. Left eye exhibits discharge. Right conjunctiva is injected. Left conjunctiva is injected. Left conjunctiva has a hemorrhage.    Bilateral conjunctival injection sparing the limbus.  Fluorescein uptake noted over bilateral corneas, left worse than right. Bilateral lids with edema. No facial tenderness or periorbital tenderness.  Cardiovascular: Normal rate and intact distal pulses.   Pulmonary/Chest: Effort normal.  Psychiatric: He has a normal mood and affect. His behavior is normal.  Nursing note and vitals reviewed.    ED Treatments / Results  Labs (all labs ordered are listed, but only abnormal results are displayed) Labs Reviewed - No data to display  EKG  EKG Interpretation None       Radiology No results found.  Procedures Procedures (including critical care time)  Medications Ordered in ED Medications  tetracaine (PONTOCAINE) 0.5 % ophthalmic solution 2 drop (not administered)  fluorescein ophthalmic strip 1 strip (not administered)     Initial Impression / Assessment and Plan / ED Course  I have reviewed the triage vital signs and the nursing notes.  Pertinent labs & imaging results that were available during my care of the patient were reviewed by me and considered in my medical decision making (see chart for details).     Patient presenting for subsequent visit with bilateral conjunctivitis. Fluorescein uptake noted to bilateral corneas. Patient is a contact lens wearer and wore same pair for 11 months at a time. No evidence of periorbital cellulitis. Ophthalmology consulted, spoke with Dr. Sherryll Burger, who recommends patient begin using Besivance drops every 2 hours and patient follow-up in the clinic tomorrow morning at 8 AM. Stressed the importance of attending this appointment. Pt is afebrile, not in distress. Patient is safe for discharge with close ophthalmology  follow-up.  Patient discussed with and seen by Dr. Clarene Duke.  Discussed results, findings, treatment and follow up. Patient advised of return precautions. Patient verbalized understanding and agreed with plan.   Final Clinical Impressions(s) / ED Diagnoses   Final diagnoses:  Acute bacterial conjunctivitis of both eyes    New Prescriptions New Prescriptions   BESIFLOXACIN HCL (BESIVANCE) 0.6 % SUSP    Apply 1 drop to eye every 2 (two) hours.     Russo, [place] N, PA-C 04/17/17 2256    Russo, [place] N, PA-C 04/17/17 2301    Clarene Duke Ambrose Finland, MD 04/18/17 [phone]

## 2017-04-17 NOTE — ED Notes (Signed)
Bed: WTR6 Expected date:  Expected time:  Means of arrival:  Comments: 

## 2017-04-17 NOTE — ED Triage Notes (Signed)
Pt reports he was seen here for R conjunctivitis a week ago, was given 2 eye gtts, without relief.  Pt reports his L eye became infected the next day and is now swollen shut.  Drainage noted.  Pt reports blurred vision from both eyes.  Redness noted.

## 2017-06-03 ENCOUNTER — Emergency Department (HOSPITAL_COMMUNITY)
Admission: EM | Admit: 2017-06-03 | Discharge: 2017-06-03 | Disposition: A | Discharge status: Home / Self Care | Payer: Self-pay | Attending: Emergency Medicine | Admitting: Emergency Medicine

## 2017-06-03 ENCOUNTER — Encounter (HOSPITAL_COMMUNITY): Payer: Self-pay | Admitting: Emergency Medicine

## 2017-06-03 DIAGNOSIS — W57XXXA Bitten or stung by nonvenomous insect and other nonvenomous arthropods, initial encounter: Secondary | ICD-10-CM | POA: Insufficient documentation

## 2017-06-03 DIAGNOSIS — F17210 Nicotine dependence, cigarettes, uncomplicated: Secondary | ICD-10-CM | POA: Insufficient documentation

## 2017-06-03 DIAGNOSIS — L0291 Cutaneous abscess, unspecified: Secondary | ICD-10-CM

## 2017-06-03 DIAGNOSIS — L03012 Cellulitis of left finger: Secondary | ICD-10-CM | POA: Insufficient documentation

## 2017-06-03 DIAGNOSIS — Z79899 Other long term (current) drug therapy: Secondary | ICD-10-CM | POA: Insufficient documentation

## 2017-06-03 DIAGNOSIS — Z23 Encounter for immunization: Secondary | ICD-10-CM | POA: Insufficient documentation

## 2017-06-03 LAB — CBG MONITORING, ED: Glucose-Capillary: 110 mg/dL — ABNORMAL HIGH (ref 65–99)

## 2017-06-03 MED ORDER — SULFAMETHOXAZOLE-TRIMETHOPRIM 800-160 MG PO TABS
1.0000 | ORAL_TABLET | Freq: Once | ORAL | Status: AC
  Administered 2017-06-03: 1 via ORAL
  Filled 2017-06-03: qty 1

## 2017-06-03 MED ORDER — SULFAMETHOXAZOLE-TRIMETHOPRIM 800-160 MG PO TABS: 1.0000 | ORAL_TABLET | Freq: Two times a day (BID) | ORAL | 0 refills | Status: AC

## 2017-06-03 MED ORDER — TETANUS-DIPHTH-ACELL PERTUSSIS 5-2.5-18.5 LF-MCG/0.5 IM SUSP
0.5000 mL | Freq: Once | INTRAMUSCULAR | Status: AC
  Administered 2017-06-03: 0.5 mL via INTRAMUSCULAR
  Filled 2017-06-03: qty 0.5

## 2017-06-03 MED ORDER — LIDOCAINE HCL (PF) 2 % IJ SOLN
10.0000 mL | Freq: Once | INTRAMUSCULAR | Status: AC
  Administered 2017-06-03: 10 mL
  Filled 2017-06-03: qty 10

## 2017-06-03 NOTE — ED Triage Notes (Signed)
Pt with insect bite to L first finger 6 days ago. Pt states bite area has become inflamed and painful. Pt states pain is now in L hand and moving to wrist and forearm.

## 2017-06-03 NOTE — ED Provider Notes (Signed)
[facility] Provider Note   CSN: [id number] Arrival date & time: 06/03/17  0825     History   Chief Complaint Chief Complaint  Patient presents with  . Insect Bite    HPI David Bender is a 41 y.o. male.  The history is provided by the patient.  Abscess  Location:  Hand Hand abscess location:  L fingers Abscess quality: fluctuance, induration, painful and redness   Red streaking: no   Duration:  6 days Progression:  Worsening Pain details:    Quality:  Pressure, throbbing and tightness   Severity:  Moderate   Duration:  6 days   Timing:  Constant   Progression:  Worsening Chronicity:  New Context: insect bite/sting   Context: not diabetes, not immunosuppression and not injected drug use   Context comment:  Possible spider bite.  he was fixing a drive shaft and spiders came out on his hand Relieved by:  Nothing Exacerbated by: touching. Ineffective treatments: elevation. Associated symptoms: no anorexia, no fatigue, no nausea and no vomiting   Risk factors: no family hx of MRSA, no hx of MRSA and no prior abscess     History reviewed. No pertinent past medical history.  There are no active problems to display for this patient.   Past Surgical History:  Procedure Laterality Date  . WRIST SURGERY         Home Medications    Prior to Admission medications   Medication Sig Start Date End Date Taking? Authorizing Provider  Aspirin-Acetaminophen-Caffeine (GOODY HEADACHE PO) Take 1 tablet by mouth daily as needed. For headache    [provider]  Besifloxacin HCl (BESIVANCE) 0.6 % SUSP Apply 1 drop to eye every 2 (two) hours. 04/17/17   Russo, [place] N, PA-C  clindamycin (CLEOCIN) 150 MG capsule Take 3 capsules (450 mg total) by mouth 3 (three) times daily. 04/10/17   Pisciotta, Joni Reining, PA-C  HYDROcodone-acetaminophen (NORCO/VICODIN) 5-325 MG per tablet Take 1 tablet by mouth every 6 (six) hours as needed for pain (Take 1 - 2 tablets every 4 - 6  hours.). 09/15/12   Muthersbaugh, Dahlia Client, PA-C  ibuprofen (ADVIL,MOTRIN) 800 MG tablet Take 1 tablet (800 mg total) by mouth every 8 (eight) hours as needed for mild pain or moderate pain. 09/19/16   Trixie Dredge, PA-C  methocarbamol (ROBAXIN) 750 MG tablet Take 1 tablet (750 mg total) by mouth 4 (four) times daily as needed (Take 1 tablet every 6 hours as needed for muscle spasms.). 09/15/12   Muthersbaugh, Dahlia Client, PA-C  naproxen (NAPROSYN) 500 MG tablet Take 1 tablet (500 mg total) by mouth 2 (two) times daily as needed. 09/15/12   Muthersbaugh, Dahlia Client, PA-C  ofloxacin (OCUFLOX) 0.3 % ophthalmic solution Place 1 drop into the right eye 4 (four) times daily. 04/10/17   Pisciotta, Joni Reining, PA-C  penicillin v potassium (VEETID) 500 MG tablet Take 1 tablet (500 mg total) by mouth 4 (four) times daily. 09/19/16   Trixie Dredge, PA-C    Family History History reviewed. No pertinent family history.  Social History Social History  Substance Use Topics  . Smoking status: Current Every Day Smoker    Packs/day: 1.50    Years: 10.00    Types: Cigarettes  . Smokeless tobacco: Never Used  . Alcohol use No     Allergies   Patient has no known allergies.   Review of Systems Review of Systems  Constitutional: Negative for fatigue.  Gastrointestinal: Negative for anorexia, nausea and vomiting.  All other  systems reviewed and are negative.    Physical Exam Updated Vital Signs BP (!) 145/93 (BP Location: Right Arm)   Pulse 97   Temp 98.4 F (36.9 C) (Oral)   Resp 18   SpO2 98%   Physical Exam  Constitutional: He is oriented to person, place, and time. He appears well-developed and well-nourished. No distress.  HENT:  Head: Normocephalic and atraumatic.  Poor dentition  Eyes: Pupils are equal, round, and reactive to light. EOM are normal.  Cardiovascular: Normal rate and intact distal pulses.   Pulmonary/Chest: Effort normal.  Musculoskeletal: He exhibits tenderness.        Hands: Neurological: He is alert and oriented to person, place, and time.  Skin: Skin is warm and dry.  Psychiatric: He has a normal mood and affect. His behavior is normal.  Nursing note and vitals reviewed.    ED Treatments / Results  Labs (all labs ordered are listed, but only abnormal results are displayed) Labs Reviewed  CBG MONITORING, ED - Abnormal; Notable for the following:       Result Value   Glucose-Capillary 110 (*)    All other components within normal limits    EKG  EKG Interpretation None       Radiology No results found.  Procedures Procedures (including critical care time)  Medications Ordered in ED Medications  Tdap (BOOSTRIX) injection 0.5 mL (0.5 mLs Intramuscular Given 06/03/17 0929)  lidocaine (XYLOCAINE) 2 % injection 10 mL (10 mLs Infiltration Given by Other 06/03/17 0930)   INCISION AND DRAINAGE Performed by: Gwyneth Sprout Consent: Verbal consent obtained. Risks and benefits: risks, benefits and alternatives were discussed Type: abscess  Body area: left index finger  Anesthesia: local infiltration  Incision was made with a scalpel.  Local anesthetic: lidocaine 2% without epinephrine  Anesthetic total: 3 ml  Complexity: complex Blunt dissection to break up loculations  Drainage: purulent  Drainage amount: 2mL Packing material: none  Patient tolerance: Patient tolerated the procedure well with no immediate complications.     Initial Impression / Assessment and Plan / ED Course  I have reviewed the triage vital signs and the nursing notes.  Pertinent labs & imaging results that were available during my care of the patient were reviewed by me and considered in my medical decision making (see chart for details).     Patient presenting with an abscess to the left index finger on the dorsal portion of the hand. He has full range of motion of the fingers and no complicating features. I&D as above. Patient was started on  Bactrim. Tetanus shot was updated. Patient had not seen a doctor for years but fingerstick blood sugar nonfasting was 110.  Final Clinical Impressions(s) / ED Diagnoses   Final diagnoses:  Abscess  Cellulitis of finger of left hand    New Prescriptions New Prescriptions   SULFAMETHOXAZOLE-TRIMETHOPRIM (BACTRIM DS,SEPTRA DS) 800-160 MG TABLET    Take 1 tablet by mouth 2 (two) times daily.     Gwyneth Sprout, MD 06/03/17 [phone]

## 2019-05-26 ENCOUNTER — Other Ambulatory Visit (HOSPITAL_COMMUNITY): Payer: Self-pay

## 2019-05-26 ENCOUNTER — Inpatient Hospital Stay (HOSPITAL_COMMUNITY)
Admission: EM | Disposition: A | Discharge status: Home / Self Care | Payer: Self-pay | Source: Home / Self Care | Attending: Cardiology

## 2019-05-26 ENCOUNTER — Other Ambulatory Visit: Payer: Self-pay

## 2019-05-26 ENCOUNTER — Emergency Department (HOSPITAL_COMMUNITY): Payer: Self-pay

## 2019-05-26 ENCOUNTER — Inpatient Hospital Stay (HOSPITAL_COMMUNITY)
Admission: EM | Admit: 2019-05-26 | Discharge: 2019-05-28 | DRG: 247 | Disposition: A | Discharge status: Home / Self Care | Payer: Self-pay | Attending: Cardiology | Admitting: Cardiology

## 2019-05-26 ENCOUNTER — Encounter (HOSPITAL_COMMUNITY): Payer: Self-pay | Admitting: Emergency Medicine

## 2019-05-26 DIAGNOSIS — E119 Type 2 diabetes mellitus without complications: Secondary | ICD-10-CM | POA: Diagnosis present

## 2019-05-26 DIAGNOSIS — Z6841 Body Mass Index (BMI) 40.0 and over, adult: Secondary | ICD-10-CM

## 2019-05-26 DIAGNOSIS — Z87891 Personal history of nicotine dependence: Secondary | ICD-10-CM | POA: Diagnosis present

## 2019-05-26 DIAGNOSIS — I2119 ST elevation (STEMI) myocardial infarction involving other coronary artery of inferior wall: Principal | ICD-10-CM | POA: Diagnosis present

## 2019-05-26 DIAGNOSIS — E669 Obesity, unspecified: Secondary | ICD-10-CM | POA: Diagnosis present

## 2019-05-26 DIAGNOSIS — I25110 Atherosclerotic heart disease of native coronary artery with unstable angina pectoris: Secondary | ICD-10-CM

## 2019-05-26 DIAGNOSIS — I213 ST elevation (STEMI) myocardial infarction of unspecified site: Secondary | ICD-10-CM

## 2019-05-26 DIAGNOSIS — Z9861 Coronary angioplasty status: Secondary | ICD-10-CM | POA: Diagnosis present

## 2019-05-26 DIAGNOSIS — I2510 Atherosclerotic heart disease of native coronary artery without angina pectoris: Secondary | ICD-10-CM | POA: Diagnosis present

## 2019-05-26 DIAGNOSIS — F17210 Nicotine dependence, cigarettes, uncomplicated: Secondary | ICD-10-CM | POA: Diagnosis present

## 2019-05-26 DIAGNOSIS — Z955 Presence of coronary angioplasty implant and graft: Secondary | ICD-10-CM

## 2019-05-26 DIAGNOSIS — Z79899 Other long term (current) drug therapy: Secondary | ICD-10-CM

## 2019-05-26 DIAGNOSIS — Z20828 Contact with and (suspected) exposure to other viral communicable diseases: Secondary | ICD-10-CM | POA: Diagnosis present

## 2019-05-26 DIAGNOSIS — I2111 ST elevation (STEMI) myocardial infarction involving right coronary artery: Secondary | ICD-10-CM

## 2019-05-26 DIAGNOSIS — Z720 Tobacco use: Secondary | ICD-10-CM

## 2019-05-26 HISTORY — PX: CORONARY STENT INTERVENTION: CATH118234

## 2019-05-26 HISTORY — DX: ST elevation (STEMI) myocardial infarction involving other coronary artery of inferior wall: I21.19

## 2019-05-26 HISTORY — DX: Atherosclerotic heart disease of native coronary artery with unstable angina pectoris: I25.110

## 2019-05-26 HISTORY — PX: LEFT HEART CATH AND CORONARY ANGIOGRAPHY: CATH118249

## 2019-05-26 HISTORY — DX: Tobacco use: Z72.0

## 2019-05-26 HISTORY — PX: CORONARY/GRAFT ACUTE MI REVASCULARIZATION: CATH118305

## 2019-05-26 LAB — BASIC METABOLIC PANEL
Anion gap: 11 (ref 5–15)
BUN: 17 mg/dL (ref 6–20)
CO2: 28 mmol/L (ref 22–32)
Calcium: 9.5 mg/dL (ref 8.9–10.3)
Chloride: 103 mmol/L (ref 98–111)
Creatinine, Ser: 0.94 mg/dL (ref 0.61–1.24)
GFR calc Af Amer: >60 mL/min (ref >60)
GFR calc non Af Amer: >60 mL/min (ref >60)
Glucose, Bld: 133 mg/dL — ABNORMAL HIGH (ref 70–99)
Potassium: 4.6 mmol/L (ref 3.5–5.1)
Sodium: 142 mmol/L (ref 135–145)

## 2019-05-26 LAB — POCT ACTIVATED CLOTTING TIME
Activated Clotting Time: 268 seconds
Activated Clotting Time: 313 seconds

## 2019-05-26 LAB — POCT I-STAT, CHEM 8
BUN: 17 mg/dL (ref 6–20)
Calcium, Ion: 1.24 mmol/L (ref 1.15–1.40)
Chloride: 104 mmol/L (ref 98–111)
Creatinine, Ser: 0.8 mg/dL (ref 0.61–1.24)
Glucose, Bld: 142 mg/dL — ABNORMAL HIGH (ref 70–99)
HCT: 44 % (ref 39.0–52.0)
Hemoglobin: 15 g/dL (ref 13.0–17.0)
Potassium: 4 mmol/L (ref 3.5–5.1)
Sodium: 143 mmol/L (ref 135–145)
TCO2: 25 mmol/L (ref 22–32)

## 2019-05-26 LAB — PROTIME-INR
INR: 1 (ref 0.8–1.2)
Prothrombin Time: 12.9 seconds (ref 11.4–15.2)

## 2019-05-26 LAB — CBC
HCT: 50.1 % (ref 39.0–52.0)
Hemoglobin: 17.4 g/dL — ABNORMAL HIGH (ref 13.0–17.0)
MCH: 31.8 pg (ref 26.0–34.0)
MCHC: 34.7 g/dL (ref 30.0–36.0)
MCV: 91.6 fL (ref 80.0–100.0)
Platelets: 248 10*3/uL (ref 150–400)
RBC: 5.47 MIL/uL (ref 4.22–5.81)
RDW: 12.8 % (ref 11.5–15.5)
WBC: 13.3 10*3/uL — ABNORMAL HIGH (ref 4.0–10.5)
nRBC: 0 % (ref 0.0–0.2)

## 2019-05-26 LAB — LIPID PANEL
Cholesterol: 204 mg/dL — ABNORMAL HIGH (ref 0–200)
HDL: 30 mg/dL — ABNORMAL LOW (ref >40)
LDL Cholesterol: UNDETERMINED mg/dL (ref 0–99)
Total CHOL/HDL Ratio: 6.8 RATIO
Triglycerides: 490 mg/dL — ABNORMAL HIGH (ref <150)
VLDL: UNDETERMINED mg/dL (ref 0–40)

## 2019-05-26 LAB — TROPONIN I (HIGH SENSITIVITY)
Troponin I (High Sensitivity): 212 ng/L (ref <18)
Troponin I (High Sensitivity): 7434 ng/L (ref <18)

## 2019-05-26 LAB — HEMOGLOBIN A1C
Hgb A1c MFr Bld: 6.6 % — ABNORMAL HIGH (ref 4.8–5.6)
Mean Plasma Glucose: 142.72 mg/dL

## 2019-05-26 LAB — APTT: aPTT: 29 seconds (ref 24–36)

## 2019-05-26 LAB — SARS CORONAVIRUS 2 BY RT PCR (HOSPITAL ORDER, PERFORMED IN ~~LOC~~ HOSPITAL LAB): SARS Coronavirus 2: NEGATIVE

## 2019-05-26 LAB — LDL CHOLESTEROL, DIRECT: Direct LDL: 106.6 mg/dL — ABNORMAL HIGH (ref 0–99)

## 2019-05-26 LAB — MRSA PCR SCREENING: MRSA by PCR: POSITIVE — AB

## 2019-05-26 SURGERY — CORONARY/GRAFT ACUTE MI REVASCULARIZATION
Anesthesia: LOCAL

## 2019-05-26 MED ORDER — ACETAMINOPHEN 325 MG PO TABS: 650.0000 mg | ORAL_TABLET | ORAL | Status: DC | PRN

## 2019-05-26 MED ORDER — MIDAZOLAM HCL 2 MG/2ML IJ SOLN
INTRAMUSCULAR | Status: AC
  Filled 2019-05-26: qty 2

## 2019-05-26 MED ORDER — TIROFIBAN (AGGRASTAT) BOLUS VIA INFUSION
INTRAVENOUS | Status: DC | PRN
  Administered 2019-05-26: 3800 ug via INTRAVENOUS

## 2019-05-26 MED ORDER — TICAGRELOR 90 MG PO TABS
90.0000 mg | ORAL_TABLET | Freq: Two times a day (BID) | ORAL | Status: DC
  Administered 2019-05-26 – 2019-05-28 (×4): 90 mg via ORAL
  Filled 2019-05-26 (×4): qty 1

## 2019-05-26 MED ORDER — VERAPAMIL HCL 2.5 MG/ML IV SOLN
INTRAVENOUS | Status: DC | PRN
  Administered 2019-05-26: 10 mL via INTRA_ARTERIAL

## 2019-05-26 MED ORDER — LABETALOL HCL 5 MG/ML IV SOLN: 10.0000 mg | INTRAVENOUS | Status: AC | PRN

## 2019-05-26 MED ORDER — NITROGLYCERIN 1 MG/10 ML FOR IR/CATH LAB
INTRA_ARTERIAL | Status: DC | PRN
  Administered 2019-05-26: 200 ug via INTRACORONARY

## 2019-05-26 MED ORDER — MIDAZOLAM HCL 2 MG/2ML IJ SOLN
INTRAMUSCULAR | Status: DC | PRN
  Administered 2019-05-26: 2 mg via INTRAVENOUS

## 2019-05-26 MED ORDER — FENTANYL CITRATE (PF) 100 MCG/2ML IJ SOLN
INTRAMUSCULAR | Status: AC
  Filled 2019-05-26: qty 2

## 2019-05-26 MED ORDER — TIROFIBAN HCL IN NACL 5-0.9 MG/100ML-% IV SOLN
INTRAVENOUS | Status: AC
  Filled 2019-05-26: qty 100

## 2019-05-26 MED ORDER — HYDROMORPHONE HCL 1 MG/ML IJ SOLN
INTRAMUSCULAR | Status: AC
  Filled 2019-05-26: qty 1

## 2019-05-26 MED ORDER — SODIUM CHLORIDE 0.9% FLUSH: 3.0000 mL | INTRAVENOUS | Status: DC | PRN

## 2019-05-26 MED ORDER — HEPARIN SODIUM (PORCINE) 1000 UNIT/ML IJ SOLN
INTRAMUSCULAR | Status: AC
  Filled 2019-05-26: qty 1

## 2019-05-26 MED ORDER — SODIUM CHLORIDE 0.9 % IV SOLN: INTRAVENOUS | Status: AC

## 2019-05-26 MED ORDER — ATORVASTATIN CALCIUM 80 MG PO TABS
80.0000 mg | ORAL_TABLET | Freq: Every day | ORAL | Status: DC
  Administered 2019-05-27: 17:00:00 80 mg via ORAL
  Filled 2019-05-26: qty 1

## 2019-05-26 MED ORDER — LIDOCAINE HCL (PF) 1 % IJ SOLN
INTRAMUSCULAR | Status: DC | PRN
  Administered 2019-05-26: 2 mL

## 2019-05-26 MED ORDER — NITROGLYCERIN 1 MG/10 ML FOR IR/CATH LAB
INTRA_ARTERIAL | Status: AC
  Filled 2019-05-26: qty 10

## 2019-05-26 MED ORDER — ASPIRIN 81 MG PO CHEW
81.0000 mg | CHEWABLE_TABLET | Freq: Every day | ORAL | Status: DC
  Filled 2019-05-26: qty 1

## 2019-05-26 MED ORDER — METOPROLOL TARTRATE 25 MG PO TABS
25.0000 mg | ORAL_TABLET | Freq: Two times a day (BID) | ORAL | Status: DC
  Administered 2019-05-26 – 2019-05-28 (×5): 25 mg via ORAL
  Filled 2019-05-26 (×5): qty 1

## 2019-05-26 MED ORDER — SODIUM CHLORIDE 0.9% FLUSH
3.0000 mL | Freq: Two times a day (BID) | INTRAVENOUS | Status: DC
  Administered 2019-05-26 – 2019-05-28 (×4): 3 mL via INTRAVENOUS

## 2019-05-26 MED ORDER — HYDRALAZINE HCL 20 MG/ML IJ SOLN: 10.0000 mg | INTRAMUSCULAR | Status: DC | PRN

## 2019-05-26 MED ORDER — HEPARIN (PORCINE) 25000 UT/250ML-% IV SOLN
1800.0000 [IU]/h | INTRAVENOUS | Status: DC
  Administered 2019-05-26: 21:00:00 1200 [IU]/h via INTRAVENOUS
  Administered 2019-05-27: 14:00:00 1800 [IU]/h via INTRAVENOUS
  Filled 2019-05-26 (×4): qty 250

## 2019-05-26 MED ORDER — HYDROMORPHONE HCL 1 MG/ML IJ SOLN
1.0000 mg | INTRAMUSCULAR | Status: DC | PRN
  Administered 2019-05-26: 1 mg via INTRAVENOUS

## 2019-05-26 MED ORDER — HEPARIN SODIUM (PORCINE) 5000 UNIT/ML IJ SOLN
4000.0000 [IU] | Freq: Once | INTRAMUSCULAR | Status: AC
  Administered 2019-05-26: 4000 [IU] via INTRAVENOUS
  Filled 2019-05-26: qty 1

## 2019-05-26 MED ORDER — SODIUM CHLORIDE 0.9 % IV SOLN: 250.0000 mL | INTRAVENOUS | Status: DC | PRN

## 2019-05-26 MED ORDER — HEPARIN (PORCINE) IN NACL 1000-0.9 UT/500ML-% IV SOLN
INTRAVENOUS | Status: AC
  Filled 2019-05-26: qty 1500

## 2019-05-26 MED ORDER — NITROGLYCERIN 0.4 MG SL SUBL: 0.4000 mg | SUBLINGUAL_TABLET | SUBLINGUAL | Status: DC | PRN

## 2019-05-26 MED ORDER — HEPARIN SODIUM (PORCINE) 1000 UNIT/ML IJ SOLN
INTRAMUSCULAR | Status: DC | PRN
  Administered 2019-05-26: 4000 [IU] via INTRAVENOUS
  Administered 2019-05-26: 10000 [IU] via INTRAVENOUS

## 2019-05-26 MED ORDER — ONDANSETRON HCL 4 MG/2ML IJ SOLN
4.0000 mg | Freq: Four times a day (QID) | INTRAMUSCULAR | Status: DC | PRN
  Administered 2019-05-26: 12:00:00 4 mg via INTRAVENOUS

## 2019-05-26 MED ORDER — ONDANSETRON HCL 4 MG/2ML IJ SOLN
INTRAMUSCULAR | Status: AC
  Filled 2019-05-26: qty 2

## 2019-05-26 MED ORDER — TIROFIBAN HCL IN NACL 5-0.9 MG/100ML-% IV SOLN
INTRAVENOUS | Status: AC | PRN
  Administered 2019-05-26: 0.15 ug/kg/min via INTRAVENOUS
  Administered 2019-05-26: 12:00:00

## 2019-05-26 MED ORDER — TIROFIBAN HCL IN NACL 5-0.9 MG/100ML-% IV SOLN: 0.1500 ug/kg/min | INTRAVENOUS | Status: AC

## 2019-05-26 MED ORDER — HEPARIN (PORCINE) IN NACL 1000-0.9 UT/500ML-% IV SOLN
INTRAVENOUS | Status: DC | PRN
  Administered 2019-05-26 (×3): 500 mL

## 2019-05-26 MED ORDER — VERAPAMIL HCL 2.5 MG/ML IV SOLN
INTRAVENOUS | Status: AC
  Filled 2019-05-26: qty 2

## 2019-05-26 MED ORDER — BIVALIRUDIN BOLUS VIA INFUSION - CUPID: INTRAVENOUS | Status: DC | PRN

## 2019-05-26 MED ORDER — ASPIRIN 81 MG PO CHEW
324.0000 mg | CHEWABLE_TABLET | Freq: Once | ORAL | Status: AC
  Administered 2019-05-26: 324 mg via ORAL
  Filled 2019-05-26: qty 4

## 2019-05-26 MED ORDER — HYDROCODONE-ACETAMINOPHEN 5-325 MG PO TABS
1.0000 | ORAL_TABLET | Freq: Four times a day (QID) | ORAL | Status: DC | PRN
  Administered 2019-05-26 – 2019-05-27 (×2): 1 via ORAL
  Filled 2019-05-26: qty 1

## 2019-05-26 MED ORDER — TICAGRELOR 90 MG PO TABS
ORAL_TABLET | ORAL | Status: AC
  Filled 2019-05-26: qty 2

## 2019-05-26 MED ORDER — LIDOCAINE HCL (PF) 1 % IJ SOLN
INTRAMUSCULAR | Status: AC
  Filled 2019-05-26: qty 30

## 2019-05-26 MED ORDER — ASPIRIN 81 MG PO CHEW
81.0000 mg | CHEWABLE_TABLET | Freq: Every day | ORAL | Status: DC
  Administered 2019-05-27 – 2019-05-28 (×2): 81 mg via ORAL
  Filled 2019-05-26 (×2): qty 1

## 2019-05-26 MED ORDER — SODIUM CHLORIDE 0.9 % IV SOLN
INTRAVENOUS | Status: AC | PRN
  Administered 2019-05-26: 50 mL/h via INTRAVENOUS

## 2019-05-26 MED ORDER — TIROFIBAN HCL IN NACL 5-0.9 MG/100ML-% IV SOLN
INTRAVENOUS | Status: AC | PRN
  Administered 2019-05-26: 0.15 ug/kg/min via INTRAVENOUS

## 2019-05-26 MED ORDER — SODIUM CHLORIDE 0.9 % IV SOLN
INTRAVENOUS | Status: DC
  Administered 2019-05-26: 07:00:00 1000 mL via INTRAVENOUS

## 2019-05-26 MED ORDER — IOHEXOL 350 MG/ML SOLN
INTRAVENOUS | Status: DC | PRN
  Administered 2019-05-26: 240 mL

## 2019-05-26 MED ORDER — CHLORHEXIDINE GLUCONATE CLOTH 2 % EX PADS
6.0000 | MEDICATED_PAD | Freq: Every day | CUTANEOUS | Status: DC
  Administered 2019-05-27: 10:00:00 6 via TOPICAL

## 2019-05-26 MED ORDER — HYDROCODONE-ACETAMINOPHEN 5-325 MG PO TABS
ORAL_TABLET | ORAL | Status: AC
  Filled 2019-05-26: qty 1

## 2019-05-26 MED ORDER — FENTANYL CITRATE (PF) 100 MCG/2ML IJ SOLN
INTRAMUSCULAR | Status: DC | PRN
  Administered 2019-05-26 (×4): 25 ug via INTRAVENOUS

## 2019-05-26 SURGICAL SUPPLY — 23 items
BALLN SAPPHIRE 1.5X12 (BALLOONS) ×2
BALLN SAPPHIRE 2.5X12 (BALLOONS) ×2
BALLOON SAPPHIRE 1.5X12 (BALLOONS) IMPLANT
BALLOON SAPPHIRE 2.5X12 (BALLOONS) IMPLANT
CATH INFINITI 5 FR JL3.5 (CATHETERS) ×1 IMPLANT
CATH INFINITI 5FR ANG PIGTAIL (CATHETERS) ×1 IMPLANT
CATH LAUNCHER 6FR JR4 (CATHETERS) ×1 IMPLANT
CATH OPTITORQUE TIG 4.0 5F (CATHETERS) ×1 IMPLANT
DEVICE RAD COMP TR BAND LRG (VASCULAR PRODUCTS) ×1 IMPLANT
GLIDESHEATH SLEND SS 6F .021 (SHEATH) ×1 IMPLANT
GUIDEWIRE INQWIRE 1.5J.035X260 (WIRE) IMPLANT
INQWIRE 1.5J .035X260CM (WIRE) ×2
KIT ENCORE 26 ADVANTAGE (KITS) ×1 IMPLANT
KIT HEART LEFT (KITS) ×2 IMPLANT
PACK CARDIAC CATHETERIZATION (CUSTOM PROCEDURE TRAY) ×2 IMPLANT
SHEATH PROBE COVER 6X72 (BAG) ×1 IMPLANT
STENT SYNERGY DES 2.5X28 (Permanent Stent) ×1 IMPLANT
SYR MEDRAD MARK 7 150ML (SYRINGE) ×1 IMPLANT
TRANSDUCER W/STOPCOCK (MISCELLANEOUS) ×2 IMPLANT
TUBING CIL FLEX 10 FLL-RA (TUBING) ×2 IMPLANT
VALVE MANIFOLD 3 PORT W/RA/ON (MISCELLANEOUS) ×1 IMPLANT
WIRE ASAHI PROWATER 180CM (WIRE) ×1 IMPLANT
WIRE HI TORQ BMW 190CM (WIRE) ×1 IMPLANT

## 2019-05-26 NOTE — Consult Note (Signed)
Responded to page, pt unavailable, no family present, staff will page again if further chaplain services needed.  Rev. Traci Gafford Chaplain 

## 2019-05-26 NOTE — H&P (Addendum)
Cardiology Admission History and Physical:   Patient ID: [name] MRN: [id number]; DOB: September 20, 1975   Admission date: 05/26/2019  Primary Care Provider: Default, Provider, MD Primary Cardiologist: New (Dr. Herbie Baltimore) Primary Electrophysiologist:  None   Chief Complaint:  Chest Pain/STEMI  Patient Profile:   David Bender is a 43 y.o. male with no known cardiac history who presented to the [facility] ED for evaluation of chest pain and was found to have an inferior STEMI.  History of Present Illness:   [name] is a 43 year old male with no known cardiac history. He has never been diagnosed with hypertension, hyperlipidemia, or diabetes. He has a significant smoking history. He smoked off and on since the age of 47 and over the last 10 years has smoked about 2 packs per day. He is unsure of any family history of heart disease but thinks his father may of had heart problems.  Patient has been having intermittent chest pain since Sunday. However, this morning he woke up with substernal burning chest pain that radiated to his left arm at 3 am with associated sweating, shortness of breath, and nausea (no vomiting). No recent fevers or illnesses.   Currently, still having chest pain that he ranks as a 7/10 on the pain scale.  EKG in the ED showed sinus tachycardia with ST elevations in inferior leads consistent with inferior STEMI. Troponin pending. WBC 13.3, Hgb 17.4, Plts 248. BMP pending. COVID-pending.   Patient was taken to urgently to cath lab for emergency cardiac catheterization.  Of note, patient has no known medication allergies.  Heart Pathway Score:     History reviewed. No pertinent past medical history.  Past Surgical History:  Procedure Laterality Date  . WRIST SURGERY       Medications Prior to Admission: Prior to Admission medications   Medication Sig Start Date End Date Taking? Authorizing Provider  Aspirin-Acetaminophen-Caffeine (GOODY HEADACHE PO) Take 1  tablet by mouth daily as needed. For headache    [provider]  Besifloxacin HCl (BESIVANCE) 0.6 % SUSP Apply 1 drop to eye every 2 (two) hours. 04/17/17   Robinson, [place] N, PA-C  clindamycin (CLEOCIN) 150 MG capsule Take 3 capsules (450 mg total) by mouth 3 (three) times daily. 04/10/17   Pisciotta, Joni Reining, PA-C  HYDROcodone-acetaminophen (NORCO/VICODIN) 5-325 MG per tablet Take 1 tablet by mouth every 6 (six) hours as needed for pain (Take 1 - 2 tablets every 4 - 6 hours.). 09/15/12   Muthersbaugh, Dahlia Client, PA-C  ibuprofen (ADVIL,MOTRIN) 800 MG tablet Take 1 tablet (800 mg total) by mouth every 8 (eight) hours as needed for mild pain or moderate pain. 09/19/16   Trixie Dredge, PA-C  methocarbamol (ROBAXIN) 750 MG tablet Take 1 tablet (750 mg total) by mouth 4 (four) times daily as needed (Take 1 tablet every 6 hours as needed for muscle spasms.). 09/15/12   Muthersbaugh, Dahlia Client, PA-C  naproxen (NAPROSYN) 500 MG tablet Take 1 tablet (500 mg total) by mouth 2 (two) times daily as needed. 09/15/12   Muthersbaugh, Dahlia Client, PA-C  ofloxacin (OCUFLOX) 0.3 % ophthalmic solution Place 1 drop into the right eye 4 (four) times daily. 04/10/17   Pisciotta, Joni Reining, PA-C  penicillin v potassium (VEETID) 500 MG tablet Take 1 tablet (500 mg total) by mouth 4 (four) times daily. 09/19/16   Trixie Dredge, PA-C     Allergies:   No Known Allergies  Social History:   Social History   Social History Narrative  .  Not on file   Social History   Tobacco Use  . Smoking status: Current Every Day Smoker    Packs/day: 1.50    Years: 10.00    Pack years: 15.00    Types: Cigarettes  . Smokeless tobacco: Never Used  Substance Use Topics  . Alcohol use: No  . Drug use: No     Family History:   Father possibly had history of heart disease but patient is unsure. Unable to gather additional family history at this time due to need for emergency cardiac catheterization.  ROS:  Please see the history of present  illness.  Unable to gather full ROS due to need for emergency cardiac catheterization.   Physical Exam/Data:   Vitals:   05/26/19 0631  BP: (!) 158/111  Pulse: (!) 106  Resp: 20  Temp: 98.5 F (36.9 C)  TempSrc: Oral  SpO2: 98%  Weight: (!) 152 kg  Height: 6\' 2"  (1.88 m)   No intake or output data in the 24 hours ending 05/26/19 0723 Last 3 Weights 05/26/2019 04/17/2017  Weight (lbs) 335 lb 310 lb  Weight (kg) 151.955 kg 140.615 kg     Body mass index is 43.01 kg/m.  General: 43 y.o. obese Caucasian male who seems in pain but in no acute distress. HEENT: Normocephalic and atraumatic.  Neck: Supple. No JVD. Heart: RRR. Distinct S1 and S2. No murmurs, gallops, or rubs. Radial pulses 2+ and equal bilaterally. Lungs: No increased work of breathing. Clear to ausculation bilaterally. No wheezes, rhonchi, or rales.  Abdomen: Soft, non-distended, and non-tender to palpation. Bowel sounds present.  MSK: Normal strength and tone for age. Extremities: Mild lower extremity edema bilaterally. Skin: Warm and dry. Neuro: Alert and oriented x3. No focal deficits. Psych: Normal affect. Responds appropriately.  EKG:  The ECG that was done was personally reviewed and demonstrates sinus tachycardia, rate 102, with ST elevation in inferior leads.  Relevant CV Studies: N/A  Laboratory Data:  High Sensitivity Troponin:  No results for input(s): TROPONINIHS in the last 720 hours.    ChemistryNo results for input(s): NA, K, CL, CO2, GLUCOSE, BUN, CREATININE, CALCIUM, GFRNONAA, GFRAA, ANIONGAP in the last 168 hours.  No results for input(s): PROT, ALBUMIN, AST, ALT, ALKPHOS, BILITOT in the last 168 hours. Hematology Recent Labs  Lab 05/26/19 0653  WBC 13.3*  RBC 5.47  HGB 17.4*  HCT 50.1  MCV 91.6  MCH 31.8  MCHC 34.7  RDW 12.8  PLT 248   BNPNo results for input(s): BNP, PROBNP in the last 168 hours.  DDimer No results for input(s): DDIMER in the last 168 hours.    Radiology/Studies:  No results found.  Assessment and Plan:   Inferior STEMI - Patient presented with chest pain and found to have inferior STEMI. - EKG showed ST elevation in inferior leads.  - Troponin pending.  - Will check Echo. - Will check fasting lipids and hemoglobin A1c. - Patient taken to cath lab for emergency cardiac catheterization. Further recommending following cath.  Severity of Illness: The appropriate patient status for this patient is INPATIENT. Inpatient status is judged to be reasonable and necessary in order to provide the required intensity of service to ensure the patient's safety. The patient's presenting symptoms, physical exam findings, and initial radiographic and laboratory data in the context of their chronic comorbidities is felt to place them at high risk for further clinical deterioration. Furthermore, it is not anticipated that the patient will be medically stable for discharge from  the hospital within 2 midnights of admission. The following factors support the patient status of inpatient.   " The patient's presenting symptoms include chest pain. " The worrisome physical exam findings as above. " The initial radiographic and laboratory data are worrisome because of ST elevation on EKG " The chronic co-morbidities include tobacco abuse.   * I certify that at the point of admission it is my clinical judgment that the patient will require inpatient hospital care spanning beyond 2 midnights from the point of admission due to high intensity of service, high risk for further deterioration and high frequency of surveillance required.*    For questions or updates, please contact [facility] Please consult [email or web address] for contact info under        Signed, Corrin ParkerCallie E Goodrich, PA-C  05/26/2019 7:23 AM    ATTENDING ATTESTATION  I have seen, examined and evaluated the patient this AMalong with Marjie Skiffallie Goodrich, PA-C.  After reviewing all the available  data and chart, we discussed the patients laboratory, study & physical findings as well as symptoms in detail. I agree with her findings, examination as well as impression recommendations as per our discussion.    43 year old smoker who presented with inferior STEMI.  Sounds like he is been having intermittent angina since Sunday now declaring itself as a STEMI with beta-blocker away.  He has inferior elevations with some Q waves.  This may be stuttering angina as his troponin may show.  Plan is to take him to the cardiac catheterization lab for possible emergency PCI.  More to follow after PCI.  Will need lipid panel, A1c--high-dose statin.   As blood pressure tolerates beta-blocker plus/minus ARB Smoking cessation counseling and nutrition counseling.    Bryan Lemmaavid Harding, M.D., M.S. Interventional Cardiologist   Pager # [phone] Phone # [phone] [address] [place], [place] [id number]

## 2019-05-26 NOTE — ED Triage Notes (Signed)
Patient reports central chest pain radiating to left arm with mild SOB , nausea and diaphoresis onset last Sunday . No cough or fever .

## 2019-05-26 NOTE — Progress Notes (Signed)
ANTICOAGULATION CONSULT NOTE - Initial Consult  Pharmacy Consult for heparin + tirofiban Indication: chest pain/ACS  No Known Allergies  Patient Measurements: Height: 6\' 2"  (188 cm) Weight: (!) 335 lb (152 kg) IBW/kg (Calculated) : 82.2 Heparin Dosing Weight: 117 kg  Vital Signs: Temp: 98.5 F (36.9 C) (10/01 0631) Temp Source: Oral (10/01 0631) BP: 113/74 (10/01 1255) Pulse Rate: 72 (10/01 1255)  Labs: Recent Labs    05/26/19 0653 05/26/19 0738  HGB 17.4* 15.0  HCT 50.1 44.0  PLT 248  --   APTT 29  --   LABPROT 12.9  --   INR 1.0  --   CREATININE 0.94 0.80  TROPONINIHS 212*  --     Estimated Creatinine Clearance: 189.2 mL/min (by C-G formula based on SCr of 0.8 mg/dL).   Medical History: Past Medical History:  Diagnosis Date  . Acute ST elevation myocardial infarction (STEMI) of inferior wall ([place]) 05/26/2019  . Coronary artery disease involving native coronary artery of native heart with unstable angina pectoris ([place]) 05/26/2019   100% dRCA - DES PCI (Synergy DES 2.5 x 28 - 2.65 mm); pLAD focal 90%.  Co-dominant  . Tobacco abuse - 2PPD 05/26/2019    Assessment: 43 yo morbidly obese M admitted with STEMI. Pt s/p LHC found to have 2vCAD of RCA and LAD s/p DES to RCA. Pharmacy asked to restart heparin in 8hr in anticipation of staged PCI and continue tirofiban x6hr.  Goal of Therapy:  Heparin level 0.3-0.7 units/ml Monitor platelets by anticoagulation protocol: Yes   Plan:  -Tirofiban 0.15 mcg/kg/min x6hr -Heparin 1200 unitshr no bolus at 1630 -Heparin level later tonight -Daily heparin level and CBC   Arrie Senate, PharmD, BCPS Clinical Pharmacist [phone] Please check AMION for all [place] numbers 05/26/2019

## 2019-05-26 NOTE — Progress Notes (Signed)
Dr Marletta Lor informed of pt troponin level- information acknowledged. Clarification sought regarding orders for cath and IVF rates. Per MD, hold orders until AM and release at 0900.

## 2019-05-26 NOTE — Progress Notes (Signed)
TR BAND REMOVAL  LOCATION:    Radial  Rt radial  DEFLATED PER PROTOCOL:   yes  TIME BAND OFF / DRESSING APPLIED:    1600/gauze and tegaderm  SITE UPON ARRIVAL:    Level 0  SITE AFTER BAND REMOVAL:    Level 0  CIRCULATION SENSATION AND MOVEMENT:    Within Normal Limits :  Yes, palpable rt radial, rt hand and fingers warm and pink, sensation present  COMMENTS:

## 2019-05-26 NOTE — ED Notes (Signed)
Called to activate code stemi @ 6:55 AM

## 2019-05-26 NOTE — ED Provider Notes (Signed)
[facility] Provider Note   CSN: [id number] Arrival date & time: 05/26/19  [id number]     History   Chief Complaint Chief Complaint  Patient presents with  . Chest Pain  . Code STEMI   Level 5 caveat due to acuity of condition HPI Coral CeoBrian L Zeringue is a 43 y.o. male.     The history is provided by the patient. The history is limited by the condition of the patient.  Chest Pain Pain location:  Substernal area Pain quality: burning   Pain radiates to:  L arm Pain severity:  Severe Onset quality:  Sudden Duration:  4 hours Timing:  Constant Progression:  Worsening Chronicity:  New Relieved by:  None tried Worsened by:  Nothing Associated symptoms: nausea and shortness of breath   Patient reports for the past 3 days has had intermittent chest pain.  He woke up at 3 AM with severe chest pain and burning with radiation to left arm.  No diaphoresis, but he does report nausea.  He has never had this before   PMH-none Soc hx - smoker Past Surgical History:  Procedure Laterality Date  . WRIST SURGERY          Home Medications    Prior to Admission medications   Medication Sig Start Date End Date Taking? Authorizing Provider  Aspirin-Acetaminophen-Caffeine (GOODY HEADACHE PO) Take 1 tablet by mouth daily as needed. For headache    [provider]  Besifloxacin HCl (BESIVANCE) 0.6 % SUSP Apply 1 drop to eye every 2 (two) hours. 04/17/17   Robinson, [place] N, PA-C  clindamycin (CLEOCIN) 150 MG capsule Take 3 capsules (450 mg total) by mouth 3 (three) times daily. 04/10/17   Pisciotta, Joni ReiningNicole, PA-C  HYDROcodone-acetaminophen (NORCO/VICODIN) 5-325 MG per tablet Take 1 tablet by mouth every 6 (six) hours as needed for pain (Take 1 - 2 tablets every 4 - 6 hours.). 09/15/12   Muthersbaugh, Dahlia ClientHannah, PA-C  ibuprofen (ADVIL,MOTRIN) 800 MG tablet Take 1 tablet (800 mg total) by mouth every 8 (eight) hours as needed for mild pain or moderate pain.  09/19/16   Trixie DredgeWest, Emily, PA-C  methocarbamol (ROBAXIN) 750 MG tablet Take 1 tablet (750 mg total) by mouth 4 (four) times daily as needed (Take 1 tablet every 6 hours as needed for muscle spasms.). 09/15/12   Muthersbaugh, Dahlia ClientHannah, PA-C  naproxen (NAPROSYN) 500 MG tablet Take 1 tablet (500 mg total) by mouth 2 (two) times daily as needed. 09/15/12   Muthersbaugh, Dahlia ClientHannah, PA-C  ofloxacin (OCUFLOX) 0.3 % ophthalmic solution Place 1 drop into the right eye 4 (four) times daily. 04/10/17   Pisciotta, Joni ReiningNicole, PA-C  penicillin v potassium (VEETID) 500 MG tablet Take 1 tablet (500 mg total) by mouth 4 (four) times daily. 09/19/16   Trixie DredgeWest, Emily, PA-C    Family History No family history on file.  Social History Social History   Tobacco Use  . Smoking status: Current Every Day Smoker    Packs/day: 1.50    Years: 10.00    Pack years: 15.00    Types: Cigarettes  . Smokeless tobacco: Never Used  Substance Use Topics  . Alcohol use: No  . Drug use: No     Allergies   Patient has no known allergies.   Review of Systems Review of Systems  Unable to perform ROS: Acuity of condition  Respiratory: Positive for shortness of breath.   Cardiovascular: Positive for chest pain.  Gastrointestinal: Positive for nausea. Negative for blood  in stool.     Physical Exam Updated Vital Signs BP (!) 158/111 (BP Location: Left Arm)   Pulse (!) 106   Temp 98.5 F (36.9 C) (Oral)   Resp 20   Ht 1.88 m (6\' 2" )   Wt (!) 152 kg   SpO2 98%   BMI 43.01 kg/m   Physical Exam CONSTITUTIONAL: Well developed/well nourished, uncomfortable appearing HEAD: Normocephalic/atraumatic EYES: EOMI ENMT: Mask in place NECK: supple no meningeal signs SPINE/BACK:entire spine nontender CV: S1/S2 noted, no murmurs/rubs/gallops noted LUNGS: Lungs are clear to auscultation bilaterally, no apparent distress ABDOMEN: soft, nontender, obese GU:no cva tenderness NEURO: Pt is awake/alert/appropriate, moves all extremitiesx4.   No facial droop.   EXTREMITIES: pulses normal/equal x4, full ROM SKIN: warm, color normal PSYCH: Unable to assess   ED Treatments / Results  Labs (all labs ordered are listed, but only abnormal results are displayed) Labs Reviewed  CBC - Abnormal; Notable for the following components:      Result Value   WBC 13.3 (*)    Hemoglobin 17.4 (*)    All other components within normal limits  SARS CORONAVIRUS 2 (HOSPITAL ORDER, PERFORMED IN [facility])  BASIC METABOLIC PANEL  PROTIME-INR  APTT  LIPID PANEL  TROPONIN I (HIGH SENSITIVITY)    EKG EKG Interpretation  Date/Time:  Thursday May 26 2019 06:35:38 EDT Ventricular Rate:  105 PR Interval:  178 QRS Duration: 108 QT Interval:  348 QTC Calculation: 459 R Axis:   -61 Text Interpretation:  Sinus tachycardia Left axis deviation Inferior infarct , possibly acute Anterolateral infarct , age undetermined ** ** ** ** * ACUTE MI  ** ** ** ** Abnormal ECG Confirmed by 07-06-2002 (Zadie Rhine) on 05/26/2019 6:54:04 AM   Radiology No results found.  Procedures .Critical Care Performed by: 07/26/2019, MD Authorized by: Zadie Rhine, MD   Critical care provider statement:    Critical care time (minutes):  30   Critical care start time:  05/26/2019 6:40 AM   Critical care end time:  05/26/2019 7:10 AM   Critical care was necessary to treat or prevent imminent or life-threatening deterioration of the following conditions:  Cardiac failure and circulatory failure   Critical care was time spent personally by me on the following activities:  Pulse oximetry, discussions with consultants, examination of patient and evaluation of patient's response to treatment   I assumed direction of critical care for this patient from another provider in my specialty: no       Medications Ordered in ED Medications  0.9 %  sodium chloride infusion (has no administration in time range)  aspirin chewable tablet 324 mg (has no  administration in time range)  heparin injection 4,000 Units (has no administration in time range)  nitroGLYCERIN (NITROSTAT) SL tablet 0.4 mg (has no administration in time range)     Initial Impression / Assessment and Plan / ED Course  I have reviewed the triage vital signs and the nursing notes.  Pertinent labs & imaging results that were available during my care of the patient were reviewed by me and considered in my medical decision making (see chart for details).        7:07 AM Patient presents with chest pain worse over the past several hours.  EKG shows concerning signs of STEMI.  Discussed with Dr. 07/26/2019 with cardiology.  Patient to be taken to Cath Lab  Final Clinical Impressions(s) / ED Diagnoses   Final diagnoses:  None    ED  Discharge Orders    None       Ripley Fraise, MD 05/26/19 [phone]

## 2019-05-27 ENCOUNTER — Encounter (HOSPITAL_COMMUNITY)
Admission: EM | Disposition: A | Discharge status: Home / Self Care | Payer: Self-pay | Source: Home / Self Care | Attending: Cardiology

## 2019-05-27 ENCOUNTER — Encounter (HOSPITAL_COMMUNITY): Payer: Self-pay | Admitting: Cardiology

## 2019-05-27 ENCOUNTER — Telehealth (HOSPITAL_COMMUNITY): Payer: Self-pay

## 2019-05-27 ENCOUNTER — Inpatient Hospital Stay (HOSPITAL_COMMUNITY): Payer: Self-pay

## 2019-05-27 DIAGNOSIS — E119 Type 2 diabetes mellitus without complications: Secondary | ICD-10-CM

## 2019-05-27 DIAGNOSIS — I2119 ST elevation (STEMI) myocardial infarction involving other coronary artery of inferior wall: Secondary | ICD-10-CM

## 2019-05-27 HISTORY — PX: TRANSTHORACIC ECHOCARDIOGRAM: SHX275

## 2019-05-27 HISTORY — PX: CORONARY STENT INTERVENTION: CATH118234

## 2019-05-27 LAB — BASIC METABOLIC PANEL
Anion gap: 12 (ref 5–15)
BUN: 12 mg/dL (ref 6–20)
CO2: 25 mmol/L (ref 22–32)
Calcium: 8.9 mg/dL (ref 8.9–10.3)
Chloride: 103 mmol/L (ref 98–111)
Creatinine, Ser: 0.82 mg/dL (ref 0.61–1.24)
GFR calc Af Amer: >60 mL/min (ref >60)
GFR calc non Af Amer: >60 mL/min (ref >60)
Glucose, Bld: 102 mg/dL — ABNORMAL HIGH (ref 70–99)
Potassium: 3.9 mmol/L (ref 3.5–5.1)
Sodium: 140 mmol/L (ref 135–145)

## 2019-05-27 LAB — HEPARIN LEVEL (UNFRACTIONATED)
Heparin Unfractionated: <0.1 IU/mL — ABNORMAL LOW (ref 0.30–0.70)
Heparin Unfractionated: <0.1 IU/mL — ABNORMAL LOW (ref 0.30–0.70)

## 2019-05-27 LAB — LIPID PANEL
Cholesterol: 172 mg/dL (ref 0–200)
HDL: 29 mg/dL — ABNORMAL LOW (ref >40)
LDL Cholesterol: 74 mg/dL (ref 0–99)
Total CHOL/HDL Ratio: 5.9 RATIO
Triglycerides: 343 mg/dL — ABNORMAL HIGH (ref <150)
VLDL: 69 mg/dL — ABNORMAL HIGH (ref 0–40)

## 2019-05-27 LAB — CBC
HCT: 44.8 % (ref 39.0–52.0)
Hemoglobin: 15.3 g/dL (ref 13.0–17.0)
MCH: 31.1 pg (ref 26.0–34.0)
MCHC: 34.2 g/dL (ref 30.0–36.0)
MCV: 91.1 fL (ref 80.0–100.0)
Platelets: 210 10*3/uL (ref 150–400)
RBC: 4.92 MIL/uL (ref 4.22–5.81)
RDW: 13 % (ref 11.5–15.5)
WBC: 12 10*3/uL — ABNORMAL HIGH (ref 4.0–10.5)
nRBC: 0 % (ref 0.0–0.2)

## 2019-05-27 LAB — HEMOGLOBIN A1C
Hgb A1c MFr Bld: 6.4 % — ABNORMAL HIGH (ref 4.8–5.6)
Mean Plasma Glucose: 136.98 mg/dL

## 2019-05-27 LAB — POCT ACTIVATED CLOTTING TIME: Activated Clotting Time: 290 seconds

## 2019-05-27 LAB — ECHOCARDIOGRAM COMPLETE
Height: 74 in
Weight: 5283.2 oz

## 2019-05-27 SURGERY — CORONARY STENT INTERVENTION
Anesthesia: LOCAL

## 2019-05-27 MED ORDER — SODIUM CHLORIDE 0.9 % WEIGHT BASED INFUSION
1.0000 mL/kg/h | INTRAVENOUS | Status: DC
  Administered 2019-05-27: 1 mL/kg/h via INTRAVENOUS

## 2019-05-27 MED ORDER — VERAPAMIL HCL 2.5 MG/ML IV SOLN
INTRAVENOUS | Status: DC | PRN
  Administered 2019-05-27: 10 mL via INTRA_ARTERIAL

## 2019-05-27 MED ORDER — LIDOCAINE HCL (PF) 1 % IJ SOLN
INTRAMUSCULAR | Status: DC | PRN
  Administered 2019-05-27: 2 mL via INTRADERMAL

## 2019-05-27 MED ORDER — SODIUM CHLORIDE 0.9 % WEIGHT BASED INFUSION
3.0000 mL/kg/h | INTRAVENOUS | Status: DC
  Administered 2019-05-27: 3 mL/kg/h via INTRAVENOUS

## 2019-05-27 MED ORDER — MIDAZOLAM HCL 2 MG/2ML IJ SOLN
INTRAMUSCULAR | Status: AC
  Filled 2019-05-27: qty 2

## 2019-05-27 MED ORDER — IOHEXOL 350 MG/ML SOLN
INTRAVENOUS | Status: DC | PRN
  Administered 2019-05-27: 125 mL

## 2019-05-27 MED ORDER — HEPARIN SODIUM (PORCINE) 1000 UNIT/ML IJ SOLN
INTRAMUSCULAR | Status: AC
  Filled 2019-05-27: qty 1

## 2019-05-27 MED ORDER — HEPARIN (PORCINE) IN NACL 1000-0.9 UT/500ML-% IV SOLN
INTRAVENOUS | Status: AC
  Filled 2019-05-27: qty 1000

## 2019-05-27 MED ORDER — LIDOCAINE HCL (PF) 1 % IJ SOLN
INTRAMUSCULAR | Status: AC
  Filled 2019-05-27: qty 30

## 2019-05-27 MED ORDER — SODIUM CHLORIDE 0.9% FLUSH
3.0000 mL | Freq: Two times a day (BID) | INTRAVENOUS | Status: DC
  Administered 2019-05-27 – 2019-05-28 (×2): 3 mL via INTRAVENOUS

## 2019-05-27 MED ORDER — NITROGLYCERIN 1 MG/10 ML FOR IR/CATH LAB
INTRA_ARTERIAL | Status: AC
  Filled 2019-05-27: qty 10

## 2019-05-27 MED ORDER — FENTANYL CITRATE (PF) 100 MCG/2ML IJ SOLN
INTRAMUSCULAR | Status: DC | PRN
  Administered 2019-05-27 (×2): 50 ug via INTRAVENOUS

## 2019-05-27 MED ORDER — HEPARIN (PORCINE) IN NACL 1000-0.9 UT/500ML-% IV SOLN
INTRAVENOUS | Status: DC | PRN
  Administered 2019-05-27 (×2): 500 mL

## 2019-05-27 MED ORDER — LABETALOL HCL 5 MG/ML IV SOLN: 10.0000 mg | INTRAVENOUS | Status: AC | PRN

## 2019-05-27 MED ORDER — MIDAZOLAM HCL 2 MG/2ML IJ SOLN
INTRAMUSCULAR | Status: DC | PRN
  Administered 2019-05-27: 2 mg via INTRAVENOUS
  Administered 2019-05-27: 1 mg via INTRAVENOUS

## 2019-05-27 MED ORDER — NITROGLYCERIN 1 MG/10 ML FOR IR/CATH LAB
INTRA_ARTERIAL | Status: DC | PRN
  Administered 2019-05-27: 200 ug via INTRACORONARY

## 2019-05-27 MED ORDER — HYDRALAZINE HCL 20 MG/ML IJ SOLN: 10.0000 mg | INTRAMUSCULAR | Status: AC | PRN

## 2019-05-27 MED ORDER — SODIUM CHLORIDE 0.9 % WEIGHT BASED INFUSION: 3.0000 mL/kg/h | INTRAVENOUS | Status: DC

## 2019-05-27 MED ORDER — MUPIROCIN 2 % EX OINT
TOPICAL_OINTMENT | Freq: Two times a day (BID) | CUTANEOUS | Status: DC
  Administered 2019-05-27 – 2019-05-28 (×3): via NASAL
  Filled 2019-05-27 (×2): qty 22

## 2019-05-27 MED ORDER — SODIUM CHLORIDE 0.9 % WEIGHT BASED INFUSION: 1.0000 mL/kg/h | INTRAVENOUS | Status: DC

## 2019-05-27 MED ORDER — SODIUM CHLORIDE 0.9 % IV SOLN
INTRAVENOUS | Status: AC
  Administered 2019-05-27: 17:00:00 via INTRAVENOUS

## 2019-05-27 MED ORDER — HEPARIN SODIUM (PORCINE) 1000 UNIT/ML IJ SOLN
INTRAMUSCULAR | Status: DC | PRN
  Administered 2019-05-27: 10000 [IU] via INTRAVENOUS

## 2019-05-27 MED ORDER — VERAPAMIL HCL 2.5 MG/ML IV SOLN
INTRAVENOUS | Status: AC
  Filled 2019-05-27: qty 2

## 2019-05-27 MED ORDER — FENTANYL CITRATE (PF) 100 MCG/2ML IJ SOLN
INTRAMUSCULAR | Status: AC
  Filled 2019-05-27: qty 2

## 2019-05-27 MED ORDER — SODIUM CHLORIDE 0.9 % IV SOLN: 250.0000 mL | INTRAVENOUS | Status: DC | PRN

## 2019-05-27 MED ORDER — SODIUM CHLORIDE 0.9% FLUSH: 3.0000 mL | INTRAVENOUS | Status: DC | PRN

## 2019-05-27 MED FILL — Ticagrelor Tab 90 MG: ORAL | Qty: 2 | Status: AC

## 2019-05-27 SURGICAL SUPPLY — 17 items
BALLN SAPPHIRE 2.5X12 (BALLOONS) ×2
BALLN SAPPHIRE ~~LOC~~ 3.0X10 (BALLOONS) ×1 IMPLANT
BALLOON SAPPHIRE 2.5X12 (BALLOONS) IMPLANT
CATH INFINITI JR4 5F (CATHETERS) ×1 IMPLANT
CATH VISTA GUIDE 6FR XBLAD3.5 (CATHETERS) ×1 IMPLANT
DEVICE RAD COMP TR BAND LRG (VASCULAR PRODUCTS) ×1 IMPLANT
GLIDESHEATH SLEND SS 6F .021 (SHEATH) ×1 IMPLANT
GUIDEWIRE INQWIRE 1.5J.035X260 (WIRE) IMPLANT
INQWIRE 1.5J .035X260CM (WIRE) ×2
KIT ENCORE 26 ADVANTAGE (KITS) ×1 IMPLANT
KIT HEART LEFT (KITS) ×2 IMPLANT
PACK CARDIAC CATHETERIZATION (CUSTOM PROCEDURE TRAY) ×2 IMPLANT
SHEATH PROBE COVER 6X72 (BAG) ×1 IMPLANT
STENT SYNERGY DES 2.75X12 (Permanent Stent) ×1 IMPLANT
TRANSDUCER W/STOPCOCK (MISCELLANEOUS) ×2 IMPLANT
TUBING CIL FLEX 10 FLL-RA (TUBING) ×2 IMPLANT
WIRE ASAHI PROWATER 180CM (WIRE) ×1 IMPLANT

## 2019-05-27 NOTE — Progress Notes (Signed)
1015-1050 Pt for staged PCI. Began MI education. Pt sleepy towards end of ed so did not finish. Encouraged pt to read materials at later time. Will have CR follow up tomorrow to walk and complete ed. Discussed importance of brilinta with stent. Reviewed MI restrictions, NTG use, smoking cessation, and CRP 2. Pt stated he would like referral here as his listed address is for his parents. Pt did not have address to give for [place] but has phone number. Pt quit smoking for 8 years once and began again due to stress. Quit cold [place] at that time. Gave smoking cessation handout and encouraged to call 1800 quitnow if needed.  Left diabetic and heart healthy diets in room. Diabetic diet to help with carb counting since A1C elevated. Pt is interested in participating in Virtual Cardiac and Pulmonary Rehab. Pt advised that Virtual Cardiac and Pulmonary Rehab is provided at no cost to the patient.  Checklist:  1. Pt has smart device  ie smartphone and/or ipad for downloading an app  Yes 2. Reliable internet/wifi service    Yes 3. Understands how to use their smartphone and navigate within an app.  Yes   Pt verbalized understanding and is in agreement.

## 2019-05-27 NOTE — Interval H&P Note (Signed)
History and Physical Interval Note:  05/27/2019 2:43 PM  [place]  has presented today for surgery, with the diagnosis of CAD.  The various methods of treatment have been discussed with the patient and family. After consideration of risks, benefits and other options for treatment, the patient has consented to  Procedure(s): CORONARY STENT INTERVENTION (N/A) as a surgical intervention.  The patient's history has been reviewed, patient examined, no change in status, stable for surgery.  I have reviewed the patient's chart and labs.  Questions were answered to the patient's satisfaction.     Glenetta Hew

## 2019-05-27 NOTE — Progress Notes (Signed)
Patient c/o SOB after going to bathroom.  O2 sats 100%.  Alarms for ST elevation in lead V alarming.  EKG ordered and patient placed on 2L for comfort.  EKG done no new changes noted.  Echo in room to do echo.

## 2019-05-27 NOTE — Progress Notes (Signed)
Pt transported to cath lab.  Verbal report given to RN.  All questions answered.

## 2019-05-27 NOTE — H&P (View-Only) (Signed)
Progress Note  Patient Name: David Bender Date of Encounter: 05/27/2019  Primary Cardiologist: No primary care provider on file.   Subjective   David Bender was examined and evaluated at bedside this AM. He appears comfortable. He mentions that he has not had significant worsening of his chest pain. Denies any light-headedness, dizziness, palpitations or shortness of breath.  Inpatient Medications    Scheduled Meds: . aspirin  81 mg Oral Daily  . atorvastatin  80 mg Oral q1800  . Chlorhexidine Gluconate Cloth  6 each Topical Daily  . metoprolol tartrate  25 mg Oral BID  . sodium chloride flush  3 mL Intravenous Q12H  . ticagrelor  90 mg Oral BID   Continuous Infusions: . sodium chloride 1,000 mL (05/26/19 0707)  . sodium chloride    . sodium chloride 1 mL/kg/hr (05/27/19 0732)  . heparin 1,500 Units/hr (05/27/19 0311)   PRN Meds: sodium chloride, acetaminophen, HYDROcodone-acetaminophen, HYDROmorphone (DILAUDID) injection, nitroGLYCERIN, ondansetron (ZOFRAN) IV, sodium chloride flush   Vital Signs    Vitals:   05/27/19 0421 05/27/19 0500 05/27/19 0600 05/27/19 0700  BP:  105/69 (!) 112/91 112/67  Pulse:  64 71 60  Resp:  14 11 (!) 9  Temp:      TempSrc:      SpO2:  93% 94% (!) 88%  Weight: (!) 149.8 kg     Height:        Intake/Output Summary (Last 24 hours) at 05/27/2019 0744 Last data filed at 05/27/2019 0700 Gross per 24 hour  Intake 628.78 ml  Output 300 ml  Net 328.78 ml   Filed Weights   05/26/19 0631 05/27/19 0421  Weight: (!) 152 kg (!) 149.8 kg    Telemetry    No alarms - Personally Reviewed  ECG    J point elevation on anterioseptal leads, ST elevation on inferior leads, stable from prior - Personally Reviewed  Physical Exam   Gen: Well-developed, obese, NAD HEENT: NCAT head, hearing intact, EOMI Neck: supple, ROM intact, no JVD CV: RRR, S1, S2 normal, No rubs, no murmurs Pulm: CTAB, No rales, no wheezes Abd: Soft, BS+, NTND Extm: ROM  intact, Peripheral pulses intact, No peripheral edema Skin: Dry, Warm, normal turgor  Labs    Chemistry Recent Labs  Lab 05/26/19 0653 05/26/19 0738 05/27/19 0220  NA 142 143 140  K 4.6 4.0 3.9  CL 103 104 103  CO2 28  --  25  GLUCOSE 133* 142* 102*  BUN 17 17 12   CREATININE 0.94 0.80 0.82  CALCIUM 9.5  --  8.9  GFRNONAA >60  --  >60  GFRAA >60  --  >60  ANIONGAP 11  --  12     Hematology Recent Labs  Lab 05/26/19 0653 05/26/19 0738 05/27/19 0220  WBC 13.3*  --  12.0*  RBC 5.47  --  4.92  HGB 17.4* 15.0 15.3  HCT 50.1 44.0 44.8  MCV 91.6  --  91.1  MCH 31.8  --  31.1  MCHC 34.7  --  34.2  RDW 12.8  --  13.0  PLT 248  --  210    Cardiac EnzymesNo results for input(s): TROPONINI in the last 168 hours. No results for input(s): TROPIPOC in the last 168 hours.   BNPNo results for input(s): BNP, PROBNP in the last 168 hours.   DDimer No results for input(s): DDIMER in the last 168 hours.   Radiology    Dg Chest [facility] 1 View  Result  Date: 05/26/2019 CLINICAL DATA:  Chest pain. EXAM: PORTABLE CHEST 1 VIEW COMPARISON:  08/08/2010. FINDINGS: Mediastinum hilar structures normal. Cardiomegaly with normal pulmonary vascularity. No focal infiltrate. No pleural effusion pneumothorax. Degenerative change thoracic spine. IMPRESSION: 1.  Cardiomegaly.  No pulmonary venous congestion. 2.  No acute pulmonary disease. Electronically Signed   By: Thomas  Register   On: 05/26/2019 07:30    Cardiac Studies   Left Heart Cath  Codominant system with Left Posterolateral branches and right PDA  Severe two-vessel CAD: 100% Mid-distalRCA occluded (successful DES PCI with Synergy DES 2.5 mm x 28 mm--2.65 mm) and residual distal embolization to the PDA occluding the distal quarter of the PDA, 90% mid focal LAD lesion at D2  Preserved EF with distal inferoapical hypokinesis and moderate to severely elevated EDP of 20 to 23 mmHg (Acute Diastolic Heart Failure).  Patient Profile     43  y.o. male w/ PMH of tobacco use who presents with chest pain due to Inferior STEMI from distal RCA occlusion s/p PCI  Assessment & Plan    Inferior STEMI Distal RCA occlusion s/p PCI Admit w/ ST elevation on inferior leads w/ elevated troponins. Troponin trend up to 7000 overnight. EKG stable. Plan for return to cath today - F/u cath - Heparin gtt - C/w asa, brilinta, atorvastatin - C/w metoprolol tartrate 25mg BID   T2DM On admission hgb a1c found to be 6.6 - Glucose checks - SSI - Will need to start metformin at discharge.  For questions or updates, please contact [facility] Please consult [email or web address] for contact info under Cardiology/STEMI.      Signed, David Lee, MD PGY-2, [place] [facility] Pager: [phone] 05/27/2019, 7:44 AM    Attending attestation to follow  Patient seen, examined. Available data reviewed. Agree with findings, assessment, and plan as outlined by Dr Bender.  On my examination, the patient is alert, oriented, in no distress.  He is obese.  Carotid upstrokes are normal without bruits, lungs are clear, heart is regular rate and rhythm with no murmur gallop, abdomen is soft, obese, nontender.  Extremities have no edema.  The right radial site is clear.  The bandages removed and a radial pulses 2+.  Cardiac catheterization films are reviewed.  He presented yesterday with an inferior STEMI and was found to have total occlusion of the mid RCA, treated with PCI.  He was found to have severe mid LAD stenosis and will undergo staged PCI of the LAD today.  He is currently chest pain-free on IV heparin.  His medical program is reviewed and will be continued (aspirin, ticagrelor, high intensity statin drug, beta-blocker).  Consider ACE inhibitor tomorrow if blood pressure will tolerate.  Suspect he will be able to be discharged from the hospital tomorrow as long as no complications arise from today's procedure.  We discussed secondary risk reduction measures and he  understands the importance of medication adherence, complete tobacco cessation, and diet/lifestyle modification.  David Bender, M.D. 05/27/2019 9:55 AM   

## 2019-05-27 NOTE — Progress Notes (Signed)
ANTICOAGULATION CONSULT NOTE  Pharmacy Consult for heparin + tirofiban Indication: chest pain/ACS  No Known Allergies  Patient Measurements: Height: 6\' 2"  (188 cm) Weight: (!) 335 lb (152 kg) IBW/kg (Calculated) : 82.2 Heparin Dosing Weight: 117 kg  Vital Signs: Temp: 98.7 F (37.1 C) (10/01 2351) Temp Source: Oral (10/01 2351) BP: 113/69 (10/02 0205) Pulse Rate: 60 (10/02 0205)  Labs: Recent Labs    05/26/19 0653 05/26/19 0738 05/26/19 2226 05/27/19 0220  HGB 17.4* 15.0  --  15.3  HCT 50.1 44.0  --  44.8  PLT 248  --   --  210  APTT 29  --   --   --   LABPROT 12.9  --   --   --   INR 1.0  --   --   --   HEPARINUNFRC  --   --   --  <0.10*  CREATININE 0.94 0.80  --  0.82  TROPONINIHS 212*  --  7,434*  --     Estimated Creatinine Clearance: 184.6 mL/min (by C-G formula based on SCr of 0.82 mg/dL).   Medical History: Past Medical History:  Diagnosis Date  . Acute ST elevation myocardial infarction (STEMI) of inferior wall ([place]) 05/26/2019  . Coronary artery disease involving native coronary artery of native heart with unstable angina pectoris ([place]) 05/26/2019   100% dRCA - DES PCI (Synergy DES 2.5 x 28 - 2.65 mm); pLAD focal 90%.  Co-dominant  . Tobacco abuse - 2PPD 05/26/2019    Assessment: 43 yo morbidly obese M admitted with STEMI. Pt s/p LHC found to have 2vCAD of RCA and LAD s/p DES to RCA. Pharmacy asked to restart heparin in 8hr in anticipation of staged PCI and continue tirofiban x6hr.  Initial heparin level came back undetectable on 1200 units/hr. Hgb 15.3, plt 210. No s/sx of bleeding or infusion issues.   Goal of Therapy:  Heparin level 0.3-0.7 units/ml Monitor platelets by anticoagulation protocol: Yes   Plan:  -Tirofiban completed -Increase heparin infusion to 1500 units/hr  -Order heparin level in 6 hr  -Daily heparin level and CBC  Antonietta Jewel, PharmD, BCCCP Clinical Pharmacist   Please check AMION for all [place] phone  numbers After 10:00 PM, call [place] [phone] 05/27/2019

## 2019-05-27 NOTE — Progress Notes (Signed)
  Echocardiogram 2D Echocardiogram has been performed.  David Bender 05/27/2019, 1:43 PM

## 2019-05-27 NOTE — Progress Notes (Signed)
[place] for heparin Indication: chest pain/ACS  No Known Allergies  Patient Measurements: Height: 6\' 2"  (188 cm) Weight: (!) 330 lb 3.2 oz (149.8 kg) IBW/kg (Calculated) : 82.2 Heparin Dosing Weight: 117 kg  Vital Signs: Temp: 98.6 F (37 C) (10/02 0405) Temp Source: Oral (10/02 0405) BP: 112/90 (10/02 1000) Pulse Rate: 67 (10/02 1000)  Labs: Recent Labs    05/26/19 0653 05/26/19 0738 05/26/19 2226 05/27/19 0220 05/27/19 0901  HGB 17.4* 15.0  --  15.3  --   HCT 50.1 44.0  --  44.8  --   PLT 248  --   --  210  --   APTT 29  --   --   --   --   LABPROT 12.9  --   --   --   --   INR 1.0  --   --   --   --   HEPARINUNFRC  --   --   --  <0.10* <0.10*  CREATININE 0.94 0.80  --  0.82  --   TROPONINIHS 212*  --  7,434*  --   --     Estimated Creatinine Clearance: 183.1 mL/min (by C-G formula based on SCr of 0.82 mg/dL).   Medical History: Past Medical History:  Diagnosis Date  . Acute ST elevation myocardial infarction (STEMI) of inferior wall ([place]) 05/26/2019  . Coronary artery disease involving native coronary artery of native heart with unstable angina pectoris ([place]) 05/26/2019   100% dRCA - DES PCI (Synergy DES 2.5 x 28 - 2.65 mm); pLAD focal 90%.  Co-dominant  . Tobacco abuse - 2PPD 05/26/2019    Assessment: 43 yo morbidly obese M admitted with STEMI. Pt s/p LHC found to have 2vCAD of RCA and LAD s/p DES to RCA. Pharmacy asked to restart heparin for staged PCI on 10/1 -heparin remains undetectable   Goal of Therapy:  Heparin level 0.3-0.7 units/ml Monitor platelets by anticoagulation protocol: Yes   Plan:  -Increase heparin infusion to 1800 units/hr  -Will follow plans post cath  Hildred Laser, PharmD Clinical Pharmacist **Pharmacist phone directory can now be found on [email or web address] ([email or web address]).  Listed under Startup.

## 2019-05-27 NOTE — Telephone Encounter (Signed)
Attempted to call patient in regards to Cardiac Rehab - LM on VM 

## 2019-05-27 NOTE — Progress Notes (Addendum)
Progress Note  Patient Name: David Bender Date of Encounter: 05/27/2019  Primary Cardiologist: No primary care provider on file.   Subjective   David Bender was examined and evaluated at bedside this AM. He appears comfortable. He mentions that he has not had significant worsening of his chest pain. Denies any light-headedness, dizziness, palpitations or shortness of breath.  Inpatient Medications    Scheduled Meds: . aspirin  81 mg Oral Daily  . atorvastatin  80 mg Oral q1800  . Chlorhexidine Gluconate Cloth  6 each Topical Daily  . metoprolol tartrate  25 mg Oral BID  . sodium chloride flush  3 mL Intravenous Q12H  . ticagrelor  90 mg Oral BID   Continuous Infusions: . sodium chloride 1,000 mL (05/26/19 0707)  . sodium chloride    . sodium chloride 1 mL/kg/hr (05/27/19 0732)  . heparin 1,500 Units/hr (05/27/19 0311)   PRN Meds: sodium chloride, acetaminophen, HYDROcodone-acetaminophen, HYDROmorphone (DILAUDID) injection, nitroGLYCERIN, ondansetron (ZOFRAN) IV, sodium chloride flush   Vital Signs    Vitals:   05/27/19 0421 05/27/19 0500 05/27/19 0600 05/27/19 0700  BP:  105/69 (!) 112/91 112/67  Pulse:  64 71 60  Resp:  14 11 (!) 9  Temp:      TempSrc:      SpO2:  93% 94% (!) 88%  Weight: (!) 149.8 kg     Height:        Intake/Output Summary (Last 24 hours) at 05/27/2019 0744 Last data filed at 05/27/2019 0700 Gross per 24 hour  Intake 628.78 ml  Output 300 ml  Net 328.78 ml   Filed Weights   05/26/19 0631 05/27/19 0421  Weight: (!) 152 kg (!) 149.8 kg    Telemetry    No alarms - Personally Reviewed  ECG    J point elevation on anterioseptal leads, ST elevation on inferior leads, stable from prior - Personally Reviewed  Physical Exam   Gen: Well-developed, obese, NAD HEENT: NCAT head, hearing intact, EOMI Neck: supple, ROM intact, no JVD CV: RRR, S1, S2 normal, No rubs, no murmurs Pulm: CTAB, No rales, no wheezes Abd: Soft, BS+, NTND Extm: ROM  intact, Peripheral pulses intact, No peripheral edema Skin: Dry, Warm, normal turgor  Labs    Chemistry Recent Labs  Lab 05/26/19 0653 05/26/19 0738 05/27/19 0220  NA 142 143 140  K 4.6 4.0 3.9  CL 103 104 103  CO2 28  --  25  GLUCOSE 133* 142* 102*  BUN 17 17 12   CREATININE 0.94 0.80 0.82  CALCIUM 9.5  --  8.9  GFRNONAA >60  --  >60  GFRAA >60  --  >60  ANIONGAP 11  --  12     Hematology Recent Labs  Lab 05/26/19 0653 05/26/19 0738 05/27/19 0220  WBC 13.3*  --  12.0*  RBC 5.47  --  4.92  HGB 17.4* 15.0 15.3  HCT 50.1 44.0 44.8  MCV 91.6  --  91.1  MCH 31.8  --  31.1  MCHC 34.7  --  34.2  RDW 12.8  --  13.0  PLT 248  --  210    Cardiac EnzymesNo results for input(s): TROPONINI in the last 168 hours. No results for input(s): TROPIPOC in the last 168 hours.   BNPNo results for input(s): BNP, PROBNP in the last 168 hours.   DDimer No results for input(s): DDIMER in the last 168 hours.   Radiology    Dg Chest [facility] 1 View  Result  Date: 05/26/2019 CLINICAL DATA:  Chest pain. EXAM: PORTABLE CHEST 1 VIEW COMPARISON:  08/08/2010. FINDINGS: Mediastinum hilar structures normal. Cardiomegaly with normal pulmonary vascularity. No focal infiltrate. No pleural effusion pneumothorax. Degenerative change thoracic spine. IMPRESSION: 1.  Cardiomegaly.  No pulmonary venous congestion. 2.  No acute pulmonary disease. Electronically Signed   By: Maisie Fus  Register   On: 05/26/2019 07:30    Cardiac Studies   Left Heart Cath  Codominant system with Left Posterolateral branches and right PDA  Severe two-vessel CAD: 100% Mid-distalRCA occluded (successful DES PCI with Synergy DES 2.5 mm x 28 mm--2.65 mm) and residual distal embolization to the PDA occluding the distal quarter of the PDA, 90% mid focal LAD lesion at D2  Preserved EF with distal inferoapical hypokinesis and moderate to severely elevated EDP of 20 to 23 mmHg (Acute Diastolic Heart Failure).  Patient Profile     43  y.o. male w/ PMH of tobacco use who presents with chest pain due to Inferior STEMI from distal RCA occlusion s/p PCI  Assessment & Plan    Inferior STEMI Distal RCA occlusion s/p PCI Admit w/ ST elevation on inferior leads w/ elevated troponins. Troponin trend up to 7000 overnight. EKG stable. Plan for return to cath today - F/u cath - Heparin gtt - C/w asa, brilinta, atorvastatin - C/w metoprolol tartrate 25mg  BID   T2DM On admission hgb a1c found to be 6.6 - Glucose checks - SSI - Will need to start metformin at discharge.  For questions or updates, please contact [facility] Please consult [email or web address] for contact info under Cardiology/STEMI.      Signed, , MD PGY-2, [place] [facility] Pager: [phone] 05/27/2019, 7:44 AM    Attending attestation to follow  Patient seen, examined. Available data reviewed. Agree with findings, assessment, and plan as outlined by David Bender.  On my examination, the patient is alert, oriented, in no distress.  He is obese.  Carotid upstrokes are normal without bruits, lungs are clear, heart is regular rate and rhythm with no murmur gallop, abdomen is soft, obese, nontender.  Extremities have no edema.  The right radial site is clear.  The bandages removed and a radial pulses 2+.  Cardiac catheterization films are reviewed.  He presented yesterday with an inferior STEMI and was found to have total occlusion of the mid RCA, treated with PCI.  He was found to have severe mid LAD stenosis and will undergo staged PCI of the LAD today.  He is currently chest pain-free on IV heparin.  His medical program is reviewed and will be continued (aspirin, ticagrelor, high intensity statin drug, beta-blocker).  Consider ACE inhibitor tomorrow if blood pressure will tolerate.  Suspect he will be able to be discharged from the hospital tomorrow as long as no complications arise from today's procedure.  We discussed secondary risk reduction measures and he  understands the importance of medication adherence, complete tobacco cessation, and diet/lifestyle modification.  David Bender, M.D. 05/27/2019 9:55 AM

## 2019-05-28 ENCOUNTER — Encounter (HOSPITAL_COMMUNITY): Payer: Self-pay | Admitting: Nurse Practitioner

## 2019-05-28 LAB — BASIC METABOLIC PANEL
Anion gap: 8 (ref 5–15)
BUN: 12 mg/dL (ref 6–20)
CO2: 24 mmol/L (ref 22–32)
Calcium: 8.8 mg/dL — ABNORMAL LOW (ref 8.9–10.3)
Chloride: 106 mmol/L (ref 98–111)
Creatinine, Ser: 0.87 mg/dL (ref 0.61–1.24)
GFR calc Af Amer: >60 mL/min (ref >60)
GFR calc non Af Amer: >60 mL/min (ref >60)
Glucose, Bld: 96 mg/dL (ref 70–99)
Potassium: 3.9 mmol/L (ref 3.5–5.1)
Sodium: 138 mmol/L (ref 135–145)

## 2019-05-28 LAB — CBC
HCT: 42.9 % (ref 39.0–52.0)
Hemoglobin: 15.2 g/dL (ref 13.0–17.0)
MCH: 31.9 pg (ref 26.0–34.0)
MCHC: 35.4 g/dL (ref 30.0–36.0)
MCV: 90.1 fL (ref 80.0–100.0)
Platelets: 202 10*3/uL (ref 150–400)
RBC: 4.76 MIL/uL (ref 4.22–5.81)
RDW: 12.7 % (ref 11.5–15.5)
WBC: 11.8 10*3/uL — ABNORMAL HIGH (ref 4.0–10.5)
nRBC: 0 % (ref 0.0–0.2)

## 2019-05-28 MED ORDER — METOPROLOL TARTRATE 25 MG PO TABS: 25.0000 mg | ORAL_TABLET | Freq: Two times a day (BID) | ORAL | 6 refills | Status: DC

## 2019-05-28 MED ORDER — ATORVASTATIN CALCIUM 80 MG PO TABS: 80.0000 mg | ORAL_TABLET | Freq: Every day | ORAL | 6 refills | Status: DC

## 2019-05-28 MED ORDER — NICOTINE 21 MG/24HR TD PT24: MEDICATED_PATCH | TRANSDERMAL | 0 refills | Status: DC

## 2019-05-28 MED ORDER — NICOTINE 14 MG/24HR TD PT24: 14.0000 mg | MEDICATED_PATCH | Freq: Every day | TRANSDERMAL | 0 refills | Status: DC

## 2019-05-28 MED ORDER — ASPIRIN 81 MG PO CHEW: 81.0000 mg | CHEWABLE_TABLET | Freq: Every day | ORAL | 6 refills | Status: DC

## 2019-05-28 MED ORDER — NITROGLYCERIN 0.4 MG SL SUBL: 0.4000 mg | SUBLINGUAL_TABLET | SUBLINGUAL | 3 refills | Status: DC | PRN

## 2019-05-28 MED ORDER — METFORMIN HCL 500 MG PO TABS: 500.0000 mg | ORAL_TABLET | Freq: Two times a day (BID) | ORAL | 3 refills | Status: DC

## 2019-05-28 MED ORDER — TICAGRELOR 90 MG PO TABS: 90.0000 mg | ORAL_TABLET | Freq: Two times a day (BID) | ORAL | 6 refills | Status: DC

## 2019-05-28 NOTE — Progress Notes (Signed)
CARDIAC REHAB PHASE I   PRE:  Rate/Rhythm: 64 SR  BP:  Supine:   Sitting:   Standing:    SaO2: 99% RA  MODE:  Ambulation: 750 ft   POST:  Rate/Rhythm: 73  BP:  Supine:   Sitting:   Standing:    SaO2: 99% RA  [id number] Patient tolerated ambulation well. Gait steady, walking independently. Discussed nutrition, heart healthy and diabetic diet handout given, discussed low sodium and reading food labels, handout given. Exercise guidelines given. Pt very receptive and verbalizes understanding of information given.  Sol Passer, MS, ACSM CEP

## 2019-05-28 NOTE — Discharge Instructions (Signed)
**  PLEASE REMEMBER TO BRING ALL OF YOUR MEDICATIONS TO EACH OF YOUR FOLLOW-UP OFFICE VISITS. ° °NO HEAVY LIFTING X 4 WEEKS. °NO SEXUAL ACTIVITY X 4 WEEKS. °NO DRIVING X 2 WEEKS. °NO SOAKING BATHS, HOT TUBS, POOLS, ETC., X 7 DAYS. ° °Radial Site Care °Refer to this sheet in the next few weeks. These instructions provide you with information on caring for yourself after your procedure. Your caregiver may also give you more specific instructions. Your treatment has been planned according to current medical practices, but problems sometimes occur. Call your caregiver if you have any problems or questions after your procedure. °HOME CARE INSTRUCTIONS °· You may shower the day after the procedure. Remove the bandage (dressing) and gently wash the site with plain soap and water. Gently pat the site dry.  °· Do not apply powder or lotion to the site.  °· Do not submerge the affected site in water for 3 to 5 days.  °· Inspect the site at least twice daily.  °· Do not flex or bend the affected arm for 24 hours.  °· No lifting over 5 pounds (2.3 kg) for 5 days after your procedure.  °· Do not drive home if you are discharged the same day of the procedure. Have someone else drive you.  ° °What to expect: °· Any bruising will usually fade within 1 to 2 weeks.  °· Blood that collects in the tissue (hematoma) may be painful to the touch. It should usually decrease in size and tenderness within 1 to 2 weeks.  °SEEK IMMEDIATE MEDICAL CARE IF: °· You have unusual pain at the radial site.  °· You have redness, warmth, swelling, or pain at the radial site.  °· You have drainage (other than a small amount of blood on the dressing).  °· You have chills.  °· You have a fever or persistent symptoms for more than 72 hours.  °· You have a fever and your symptoms suddenly get worse.  °· Your arm becomes pale, cool, tingly, or numb.  °· You have heavy bleeding from the site. Hold pressure on the site.  ° °

## 2019-05-28 NOTE — TOC Progression Note (Signed)
Transition of Care [facility]) - Progression Note    Patient Details  Name: CROSBY ORIORDAN MRN: [id number] Date of Birth: May 27, 1976  Transition of Care [facility] CM/SW Contact  Zenon Mayo, RN Phone Number: 05/28/2019, 10:06 AM  Clinical Narrative:    From home , indep pta, s/p stent intervention, will be on brilinta, NCM gave patient the 30 day free coupon, will give him the patient ast application, informed patient to contact the [facility] clinic on Monday to schedule a hospital follow up visit and take the patient ast form with him.  NCM gave patient Match Letter to ast with other medications at dc.  Patient may get refill at the [facility] clinic for [place] for 10.00 while waiting on patient ast.     Expected Discharge Plan: Home/Self Care Barriers to Discharge: No Barriers Identified  Expected Discharge Plan and Services Expected Discharge Plan: Home/Self Care   Discharge Planning Services: CM Consult, [facility], [place], Medication Assistance Post Acute Care Choice: NA Living arrangements for the past 2 months: Single Family Home                 DME Arranged: (NA)         HH Arranged: NA           Social Determinants of Health (SDOH) Interventions    Readmission Risk Interventions No flowsheet data found.

## 2019-05-28 NOTE — Progress Notes (Signed)
Progress Note  Patient Name: David Bender Date of Encounter: 05/28/2019  Primary Cardiologist: No primary care provider on file.   Subjective   Feeling well. No chest pain or shortness of breath.  Ambulated without difficulty.  Interested in smoking cessation.   Inpatient Medications    Scheduled Meds: . aspirin  81 mg Oral Daily  . atorvastatin  80 mg Oral q1800  . Chlorhexidine Gluconate Cloth  6 each Topical Daily  . metoprolol tartrate  25 mg Oral BID  . mupirocin ointment   Nasal BID  . sodium chloride flush  3 mL Intravenous Q12H  . sodium chloride flush  3 mL Intravenous Q12H  . ticagrelor  90 mg Oral BID   Continuous Infusions: . sodium chloride 1,000 mL (05/26/19 0707)  . sodium chloride    . sodium chloride     PRN Meds: sodium chloride, sodium chloride, acetaminophen, HYDROcodone-acetaminophen, HYDROmorphone (DILAUDID) injection, nitroGLYCERIN, ondansetron (ZOFRAN) IV, sodium chloride flush, sodium chloride flush   Vital Signs    Vitals:   05/28/19 0014 05/28/19 0306 05/28/19 0755 05/28/19 1128  BP: 109/68 120/85 126/80 106/63  Pulse: 70 77  69  Resp: 14 16 16 14   Temp: 97.9 F (36.6 C) 98 F (36.7 C) 98.3 F (36.8 C) 98.2 F (36.8 C)  TempSrc: Oral Oral Oral Oral  SpO2: 98% 98% 96% 97%  Weight:      Height:        Intake/Output Summary (Last 24 hours) at 05/28/2019 1243 Last data filed at 05/28/2019 0907 Gross per 24 hour  Intake 2640.3 ml  Output 2175 ml  Net 465.3 ml   Last 3 Weights 05/27/2019 05/27/2019 05/26/2019  Weight (lbs) 337 lb 4.9 oz 330 lb 3.2 oz 335 lb  Weight (kg) 153 kg 149.778 kg 151.955 kg      Telemetry    Sinus rhythm.  No events. - Personally Reviewed  ECG    Sinus rhythm.  Rate 75 bpm.  Inferior infarct.   - Personally Reviewed  Physical Exam   VS:  BP 106/63 (BP Location: Left Arm)   Pulse 69   Temp 98.2 F (36.8 C) (Oral)   Resp 14   Ht 6\' 2"  (1.88 m)   Wt (!) 153 kg   SpO2 97%   BMI 43.31 kg/m  , BMI  Body mass index is 43.31 kg/m. GENERAL:  Well appearing HEENT: Pupils equal round and reactive, fundi not visualized, oral mucosa unremarkable NECK:  No jugular venous distention, waveform within normal limits, carotid upstroke brisk and symmetric, no bruits LUNGS:  Clear to auscultation bilaterally HEART:  RRR.  PMI not displaced or sustained,S1 and S2 within normal limits, no S3, no S4, no clicks, no rubs, no murmurs ABD:  Flat, positive bowel sounds normal in frequency in pitch, no bruits, no rebound, no guarding, no midline pulsatile mass, no hepatomegaly, no splenomegaly EXT:  2 plus pulses throughout, no edema, no cyanosis no clubbing SKIN:  No rashes no nodules NEURO:  Cranial nerves II through XII grossly intact, motor grossly intact throughout PSYCH:  Cognitively intact, oriented to person place and time   Labs    High Sensitivity Troponin:   Recent Labs  Lab 05/26/19 0653 05/26/19 2226  TROPONINIHS 212* 7,434*      Chemistry Recent Labs  Lab 05/26/19 0653 05/26/19 0738 05/27/19 0220 05/28/19 0212  NA 142 143 140 138  K 4.6 4.0 3.9 3.9  CL 103 104 103 106  CO2 28  --  25 24  GLUCOSE 133* 142* 102* 96  BUN 17 17 12 12   CREATININE 0.94 0.80 0.82 0.87  CALCIUM 9.5  --  8.9 8.8*  GFRNONAA >60  --  >60 >60  GFRAA >60  --  >60 >60  ANIONGAP 11  --  12 8     Hematology Recent Labs  Lab 05/26/19 0653 05/26/19 0738 05/27/19 0220 05/28/19 0212  WBC 13.3*  --  12.0* 11.8*  RBC 5.47  --  4.92 4.76  HGB 17.4* 15.0 15.3 15.2  HCT 50.1 44.0 44.8 42.9  MCV 91.6  --  91.1 90.1  MCH 31.8  --  31.1 31.9  MCHC 34.7  --  34.2 35.4  RDW 12.8  --  13.0 12.7  PLT 248  --  210 202    BNPNo results for input(s): BNP, PROBNP in the last 168 hours.   DDimer No results for input(s): DDIMER in the last 168 hours.   Radiology    No results found.  Cardiac Studies   Left Heart Cath  Codominant system with Left Posterolateral branches and right PDA  Severe  two-vessel CAD: 100% Mid-distalRCA occluded (successful DES PCI with Synergy DES 2.5 mm x 28 mm--2.65 mm) and residual distal embolization to the PDA occluding the distal quarter of the PDA, 90% mid focal LAD lesion at D2  Preserved EF with distal inferoapical hypokinesis and moderate to severely elevated EDP of 20 to 23 mmHg (Acute Diastolic Heart Failure).  Echo 05/27/19: IMPRESSIONS    1. Left ventricular ejection fraction, by visual estimation, is 60 to 65%. The left ventricle has normal function. There is no left ventricular hypertrophy. Inadequate for regional WMA's.  2. Left ventricular diastolic Doppler parameters are consistent with impaired relaxation pattern of LV diastolic filling.  3. Global right ventricle has normal systolic function.The right ventricular size is normal. No increase in right ventricular wall thickness.  4. Left atrial size was normal.  5. Right atrial size was normal.  6. The mitral valve is grossly normal. Trace mitral valve regurgitation.  7. The tricuspid valve is grossly normal. Tricuspid valve regurgitation was not visualized by color flow Doppler.  8. The aortic valve is tricuspid Aortic valve regurgitation was not visualized by color flow Doppler.  9. The pulmonic valve was grossly normal. Pulmonic valve regurgitation is not visualized by color flow Doppler.   Patient Profile     43 y.o. male with tobacco abuse here with inferior STEMI s/p RCA PCI.    Assessment & Plan    # Inferior STEMI: Distal RCA was 100% occluded.  He also had a 90% mid LAD lesion.  Underwent PCI of both areas with DES.  Previously placed RCA stent was patent.   EF preserved on echo this admission.  Continue aspirin, ticagrelor, atorvastatin and metoprolol.  Check lipids/CMP in 6-8 weeks.   # Diabetes: Start metformin 500mg  bid.  # Tobacco abuse:  Interested in smoking cessation.  He wants to start with patches.  He smokes 2 ppd.  Will start with nicotine patches 21mg  x6  weeks then 14mg  x2 weeks then 7mg  x2 weeks.   For questions or updates, please contact [facility] Please consult [email or web address] for contact info under        Signed, 48, MD  05/28/2019, 12:43 PM

## 2019-05-28 NOTE — Progress Notes (Signed)
Removed PIV access x 2 and patient received discharge instructions and understood it well. HS [employer]

## 2019-05-28 NOTE — Discharge Summary (Signed)
Discharge Summary    Patient ID: David Bender MRN: [id number]; DOB: 15-Jan-1976  Admit date: 05/26/2019 Discharge date: 05/28/2019  Primary Care Provider: Default, Provider, MD  Primary Cardiologist: Glenetta Hew, MD  Primary Electrophysiologist:  None   Discharge Diagnoses    Principal Problem:   Acute ST elevation myocardial infarction (STEMI) of inferior wall [facility]) Active Problems:   Coronary artery disease involving native coronary artery of native heart with unstable angina pectoris ([place])   Tobacco abuse - 2PPD   Allergies No Known Allergies  Diagnostic Studies/Procedures    Cardiac Catheterization and Percutaneous Coronary Intervention 10.1.2020  Left Anterior Descending  Mid LAD lesion 90% stenosed  Mid LAD lesion is 90% stenosed. The lesion is located at the major branch, focal and eccentric.  First Diagonal Branch  Vessel is small in size.  First Septal Branch  Vessel is small in size.  Second Diagonal Branch  Vessel is small in size.  Left Circumflex  Vessel is large. Vessel is angiographically normal. Terminates as a major posterior lateral branch  First Obtuse Marginal Branch  Vessel is moderate in size Vessel is angiographically normal.  Second Obtuse Marginal Branch  Vessel is moderate in size. Vessel is angiographically normal.  Second Left Posterolateral Branch  Vessel is angiographically normal.  Left Posterior Atrioventricular Artery  Vessel is small in size. Vessel is angiographically normal.  Right Coronary Artery  Mid RCA to Dist RCA lesion 100% stenosed  Mid RCA to Dist RCA lesion is 100% stenosed. Vessel is the culprit lesion. The lesion is segmental, irregular, thrombotic and ulcerative.      **The mid to distal RCA was successfully stented with a 2.5 x 28 mm Synergy drug-eluting stent  Acute Marginal Branch  Vessel is small in size. The vessel is tortuous.  Right Ventricular Branch  Vessel is small in size.  Right Posterior Descending  Artery  RPDA lesion 100% stenosed  RPDA lesion is 100% stenosed. The lesion is thrombotic. Some of the lesion was treated with daughter technique using 1.5 mm balloon, but not able to get flow distally.   Left Ventricle The left ventricular size is in the upper limits of normal. The left ventricular systolic function is normal. LV end diastolic pressure is moderately elevated. The left ventricular ejection fraction is 50-55% by visual estimate. There are LV function abnormalities due to segmental dysfunction.  _____________  2D Echocardiogram 10.2.2020  IMPRESSIONS  1. Left ventricular ejection fraction, by visual estimation, is 60 to 65%. The left ventricle has normal function. There is no left ventricular hypertrophy. Inadequate for regional WMA's.  2. Left ventricular diastolic Doppler parameters are consistent with impaired relaxation pattern of LV diastolic filling.  3. Global right ventricle has normal systolic function.The right ventricular size is normal. No increase in right ventricular wall thickness.  4. Left atrial size was normal.  5. Right atrial size was normal.  6. The mitral valve is grossly normal. Trace mitral valve regurgitation.  7. The tricuspid valve is grossly normal. Tricuspid valve regurgitation was not visualized by color flow Doppler.  8. The aortic valve is tricuspid Aortic valve regurgitation was not visualized by color flow Doppler.  9. The pulmonic valve was grossly normal. Pulmonic valve regurgitation is not visualized by color flow Doppler. _____________   Cardiac Catheterization and Percutaneous Coronary Intervention 10.2.2020  Left Anterior Descending  Mid LAD lesion 90% stenosed  Mid LAD lesion is 90% stenosed. The lesion is located at the major branch, focal and eccentric.      **  The mid LAD was successfully stented using a 2.75 x 12 mm Synergy drug-eluting stent  First Diagonal Branch  Vessel is small in size.  First Septal Branch  Vessel is  small in size.  Second Diagonal Branch  Vessel is small in size.  Left Circumflex  Vessel is large. Vessel is angiographically normal. Terminates as a major posterior lateral branch  First Obtuse Marginal Branch  Vessel is moderate in size Vessel is angiographically normal.  Second Obtuse Marginal Branch  Vessel is moderate in size. Vessel is angiographically normal.  Second Left Posterolateral Branch  Vessel is angiographically normal.  Left Posterior Atrioventricular Artery  Vessel is small in size. Vessel is angiographically normal.  Right Coronary Artery  Mid RCA to Dist RCA lesion 0% stenosed  Previously placed Mid RCA to Dist RCA drug eluting stent is widely patent. Vessel is the culprit lesion. The lesion is segmental, irregular, thrombotic and ulcerative.  Acute Marginal Branch  Vessel is small in size. The vessel is tortuous.  Right Ventricular Branch  Vessel is small in size.  Right Posterior Descending Artery  RPDA lesion 100% stenosed  RPDA lesion is 100% stenosed. The lesion is thrombotic. Some of the lesion was treated with daughter technique using 1.5 mm balloon, but not able to get flow distally.   _____________   History of Present Illness     43 year old male with a history of tobacco abuse dating back to the age of 43 who was in his usual state of health until about 5 days prior to admission when he began to experience intermittent chest discomfort, which worsened on October 1, prompting presentation to the emergency department.  There, he was found to have inferior ST segment elevation.  A code STEMI was activated and he was taken to the Cath Lab for emergent evaluation.  Hospital Course     Consultants: None  Diagnostic cardiac catheterization revealed a total occlusion of the mid to distal RCA and RPDA, as well as a 90% stenosis in the mid LAD.  The RCA was felt to be the infarct vessel and this was successfully treated using a 2.5 x 28 mm Synergy drug-eluting  stent.  It was felt that the LAD would require staged intervention.  Patient was admitted to the coronary intensive care unit where he peaked his high-sensitivity troponin at 7434.  Echocardiogram was performed on October 2 and revealed normal LV function without any significant valvular abnormalities.  Patient was taken back to the cardiac catheterization laboratory in October 2 and underwent repeat catheterization and PCI with drug-eluting stent placement to the LAD (2.75 x 12 mm Synergy drug-eluting stent).  Previously placed RCA stent was widely patent.  Post procedures, David Bender has been ambulating without chest pain or dyspnea.  He has been maintained on aspirin, ticagrelor, atorvastatin, and metoprolol therapy and thus far is tolerating therapy well.  Of note, his hemoglobin A1c was found to be elevated at 6.6 and in that setting, we are adding metformin therapy at discharge and [facility].  We have provided him with prescriptions for nicotine patches and he is also been counseled on the importance of smoking cessation.  He is being discharged home today in good condition and we will arrange for cardiology follow-up within the next week.   _____________  Discharge Vitals Blood pressure 106/63, pulse 69, temperature 98.2 F (36.8 C), temperature source Oral, resp. rate 14, height 6\' 2"  (  1.88 m), weight (!) 153 kg, SpO2 97 %.  Filed Weights   05/26/19 0631 05/27/19 0421 05/27/19 1613  Weight: (!) 152 kg (!) 149.8 kg (!) 153 kg    Labs & Radiologic Studies    CBC Recent Labs    05/27/19 0220 05/28/19 0212  WBC 12.0* 11.8*  HGB 15.3 15.2  HCT 44.8 42.9  MCV 91.1 90.1  PLT 210 202   Basic Metabolic Panel Recent Labs    88/91/69 0220 05/28/19 0212  NA 140 138  K 3.9 3.9  CL 103 106  CO2 25 24  GLUCOSE 102* 96  BUN 12 12  CREATININE 0.82 0.87  CALCIUM 8.9 8.8*   High Sensitivity Troponin:    Recent Labs  Lab 05/26/19 0653 05/26/19 2226  TROPONINIHS 212* 7,434*    Hemoglobin A1C Recent Labs    05/27/19 0220  HGBA1C 6.4*   Fasting Lipid Panel Recent Labs    05/26/19 0653 05/27/19 0220  CHOL 204* 172  HDL 30* 29*  LDLCALC UNABLE TO CALCULATE IF TRIGLYCERIDE OVER 400 mg/dL 74  TRIG 450* 388*  CHOLHDL 6.8 5.9  LDLDIRECT 106.6*  --    _____________  Dg Chest Port 1 View  Result Date: 05/26/2019 CLINICAL DATA:  Chest pain. EXAM: PORTABLE CHEST 1 VIEW COMPARISON:  08/08/2010. FINDINGS: Mediastinum hilar structures normal. Cardiomegaly with normal pulmonary vascularity. No focal infiltrate. No pleural effusion pneumothorax. Degenerative change thoracic spine. IMPRESSION: 1.  Cardiomegaly.  No pulmonary venous congestion. 2.  No acute pulmonary disease. Electronically Signed   By: Maisie Fus  Register   On: 05/26/2019 07:30   Disposition   Pt is being discharged home today in good condition.  Follow-up Plans & Appointments    Follow-up Information    [facility] Follow up.   Why: Please call on Monday to make you a hospital follow up apt so you can get assistance with your brilinta medication Contact information: [address] [place] [address] [phone]       Marykay Lex, MD Follow up in 1 week(s).   Specialty: Cardiology Why: we will arrange for a follow-up appointment and contact you. Contact information: [address] [place] [address] [phone]          Discharge Instructions    Amb Referral to Cardiac Rehabilitation   Complete by: As directed    Diagnosis:  STEMI Coronary Stents     After initial evaluation and assessments completed: Virtual Based Care may be provided alone or in conjunction with Phase 2 Cardiac Rehab based on patient barriers.: Yes   Call MD for:  difficulty breathing, headache or visual disturbances   Complete by: As directed    Call MD for:   redness, tenderness, or signs of infection (pain, swelling, redness, odor or green/yellow discharge around incision site)   Complete by: As directed    Diet - low sodium heart healthy   Complete by: As directed    Increase activity slowly   Complete by: As directed       Discharge Medications   Allergies as of 05/28/2019   No Known Allergies     Medication List    STOP taking these medications   Besifloxacin HCl 0.6 % Susp Commonly known as: Besivance   clindamycin 150 MG capsule Commonly known as: CLEOCIN   esomeprazole 20 MG packet Commonly known as: NEXIUM   GOODY HEADACHE PO   HYDROcodone-acetaminophen 5-325 MG tablet Commonly known as:  NORCO/VICODIN   ibuprofen 800 MG tablet Commonly known as: ADVIL   methocarbamol 750 MG tablet Commonly known as: ROBAXIN   naproxen 500 MG tablet Commonly known as: Naprosyn   ofloxacin 0.3 % ophthalmic solution Commonly known as: Ocuflox   penicillin v potassium 500 MG tablet Commonly known as: VEETID     TAKE these medications   aspirin 81 MG chewable tablet Chew 1 tablet (81 mg total) by mouth daily. Start taking on: May 29, 2019 Notes to patient: 05/29/19   atorvastatin 80 MG tablet Commonly known as: LIPITOR Take 1 tablet (80 mg total) by mouth daily at 6 PM. Notes to patient: 05/28/19 evening   metFORMIN 500 MG tablet Commonly known as: Glucophage Take 1 tablet (500 mg total) by mouth 2 (two) times daily with a meal.   metoprolol tartrate 25 MG tablet Commonly known as: LOPRESSOR Take 1 tablet (25 mg total) by mouth 2 (two) times daily. Notes to patient: 05/28/19 Evening   nicotine 21 mg/24hr patch Commonly known as: Nicoderm CQ  patch daily x 6 wks, then  patch x 2 wks, then  patch x 2 wks (additional rx provided) Notes to patient: 05/28/19   nicotine 14 mg/24hr patch Commonly known as: Nicoderm CQ Place 1 patch (14 mg total) onto the skin daily. Start after completion of  course    nicotine 14 mg/24hr patch Commonly known as: Nicoderm CQ Place 1 patch (14 mg total) onto the skin daily. Start after completion of 21 and  patches   nitroGLYCERIN 0.4 MG SL tablet Commonly known as: NITROSTAT Place 1 tablet (0.4 mg total) under the tongue every 5 (five) minutes as needed for chest pain (CP or SOB).   ticagrelor 90 MG Tabs tablet Commonly known as: BRILINTA Take 1 tablet (90 mg total) by mouth 2 (two) times daily. Notes to patient: 05/28/19 Evening        Acute coronary syndrome (MI, NSTEMI, STEMI, etc) this admission?: Yes.     AHA/ACC Clinical Performance & Quality Measures: 1. Aspirin prescribed? - Yes 2. ADP Receptor Inhibitor (Plavix/Clopidogrel, Brilinta/Ticagrelor or Effient/Prasugrel) prescribed (includes medically managed patients)? - Yes 3. Beta Blocker prescribed? - Yes 4. High Intensity Statin (Lipitor 40-80mg  or Crestor 20-40mg ) prescribed? - Yes 5. EF assessed during THIS hospitalization? - Yes 6. For EF <40%, was ACEI/ARB prescribed? - Not Applicable (EF >/= 40%) 7. For EF <40%, Aldosterone Antagonist (Spironolactone or Eplerenone) prescribed? - Not Applicable (EF >/= 40%) 8. Cardiac Rehab Phase II ordered (Included Medically managed Patients)? - Yes     Outstanding Labs/Studies   Follow-up lipids and LFTs in 6 weeks with consideration for addition of Vascepa in the setting of hypertriglyceridemia.  Duration of Discharge Encounter   Greater than 30 minutes including physician time.  Signed, Nicolasa Ducking, NP 05/28/2019, 4:07 PM

## 2019-05-30 ENCOUNTER — Encounter (HOSPITAL_COMMUNITY): Payer: Self-pay

## 2019-05-30 ENCOUNTER — Telehealth: Payer: Self-pay | Admitting: Cardiology

## 2019-05-30 ENCOUNTER — Encounter (HOSPITAL_COMMUNITY): Payer: Self-pay | Admitting: Cardiology

## 2019-05-30 NOTE — Telephone Encounter (Signed)
  LVMTCB to schedule [facility] hospital f/u appt per staff message

## 2019-05-31 ENCOUNTER — Encounter (HOSPITAL_COMMUNITY): Payer: Self-pay | Admitting: Cardiology

## 2019-06-09 ENCOUNTER — Encounter (HOSPITAL_COMMUNITY): Payer: Self-pay | Admitting: Cardiology

## 2019-06-14 ENCOUNTER — Ambulatory Visit (INDEPENDENT_AMBULATORY_CARE_PROVIDER_SITE_OTHER): Payer: Self-pay | Admitting: Cardiology

## 2019-06-14 ENCOUNTER — Other Ambulatory Visit: Payer: Self-pay

## 2019-06-14 ENCOUNTER — Encounter: Payer: Self-pay | Admitting: Cardiology

## 2019-06-14 VITALS — BP 118/70 | HR 78 | Temp 98.2°F | Ht 74.0 in | Wt 325.4 lb

## 2019-06-14 DIAGNOSIS — I2119 ST elevation (STEMI) myocardial infarction involving other coronary artery of inferior wall: Secondary | ICD-10-CM

## 2019-06-14 DIAGNOSIS — I2510 Atherosclerotic heart disease of native coronary artery without angina pectoris: Secondary | ICD-10-CM

## 2019-06-14 DIAGNOSIS — Z9861 Coronary angioplasty status: Secondary | ICD-10-CM

## 2019-06-14 DIAGNOSIS — E119 Type 2 diabetes mellitus without complications: Secondary | ICD-10-CM | POA: Insufficient documentation

## 2019-06-14 DIAGNOSIS — E1165 Type 2 diabetes mellitus with hyperglycemia: Secondary | ICD-10-CM | POA: Insufficient documentation

## 2019-06-14 DIAGNOSIS — Z720 Tobacco use: Secondary | ICD-10-CM

## 2019-06-14 NOTE — Progress Notes (Signed)
Cardiology Office Note:    Date:  06/14/2019   ID:  David Bender, DOB Apr 11, 1976, MRN [id number]  PCP:  Default, Provider, MD  Cardiologist:  Glenetta Hew, MD  Electrophysiologist:  None   Referring MD: No ref. provider found   No chief complaint on file.   History of Present Illness:    David Bender is a pleasant obese Caucasian 43 y.o. male with a hx of obesity.  He works for a [employer].  Now to work he presented 05/26/2019 with a inferior ST EMI.  At catheterization the culprit lesion was a mid RCA stenosis that was treated with PCI.  He also had a 90% LAD stenosis and was brought back for staged intervention on 05/27/2019.  Echocardiogram showed normal LV function.  During his admission he was also placed on Glucophage for new onset diabetes with a hemoglobin A1c of 6.4.  His LDL was 74, he was discharged on a high-dose statin.  He has been a smoker and admitted to me that his diet was poor, he drank a lot of Mountain Dew and 5-hour energy drinks.    He is seen today in the office for follow-up.  He is not had recurrent chest pain.  He says he has been walking for 40 minutes twice a day.  He is given up smoking and has gotten off the sodas.  He has not returned to work yet and asked about this.  He does not have disability insurance.  Past Medical History:  Diagnosis Date  . Acute ST elevation myocardial infarction (STEMI) of inferior wall ([place]) 05/26/2019  . Coronary artery disease involving native coronary artery of native heart with unstable angina pectoris ([place]) 05/26/2019   a. 05/26/2019 PCI: 100% dRCA - DES PCI (Synergy DES 2.5 x 28 - 2.65 mm); pLAD focal 90%.  Co-dominant; b. 05/27/2019 Staged PCI: LAD 90 (2.75x12 Synergy DES - 2.23mm).  . Tobacco abuse - 2PPD 05/26/2019    Past Surgical History:  Procedure Laterality Date  . CORONARY STENT INTERVENTION N/A 05/27/2019   Procedure: CORONARY STENT INTERVENTION;  Surgeon: Leonie Man, MD;  Location: [place] [facility];   Service: Cardiovascular;  Laterality: N/A;  . CORONARY STENT INTERVENTION N/A 05/26/2019   Procedure: CORONARY STENT INTERVENTION;  Surgeon: Leonie Man, MD;  Location: [place] [facility];  Service: Cardiovascular;  Laterality: N/A;  . CORONARY/GRAFT ACUTE MI REVASCULARIZATION N/A 05/26/2019   Procedure: CORONARY/GRAFT ACUTE MI REVASCULARIZATION;  Surgeon: Leonie Man, MD;  Location: [place] [facility];  Service: Cardiovascular;  Laterality: N/A;  . LEFT HEART CATH AND CORONARY ANGIOGRAPHY N/A 05/26/2019   Procedure: LEFT HEART CATH AND CORONARY ANGIOGRAPHY;  Surgeon: Leonie Man, MD;  Location: [place] [facility];  Service: Cardiovascular;  Laterality: N/A;  . WRIST SURGERY      Current Medications: Current Meds  Medication Sig  . aspirin 81 MG chewable tablet Chew 1 tablet (81 mg total) by mouth daily.  Marland Kitchen atorvastatin (LIPITOR) 80 MG tablet Take 1 tablet (80 mg total) by mouth daily at 6 PM.  . metFORMIN (GLUCOPHAGE) 500 MG tablet Take 1 tablet (500 mg total) by mouth 2 (two) times daily with a meal.  . metoprolol tartrate (LOPRESSOR) 25 MG tablet Take 1 tablet (25 mg total) by mouth 2 (two) times daily.  . nitroGLYCERIN (NITROSTAT) 0.4 MG SL tablet Place 1 tablet (0.4 mg total) under the tongue every 5 (five) minutes as needed for chest pain (CP or SOB).  Marland Kitchen  ticagrelor (BRILINTA) 90 MG TABS tablet Take 1 tablet (90 mg total) by mouth 2 (two) times daily.     Allergies:   Patient has no known allergies.   Social History   Socioeconomic History  . Marital status: Legally Separated    Spouse name: Not on file  . Number of children: Not on file  . Years of education: Not on file  . Highest education level: Not on file  Occupational History  . Not on file  Social Needs  . Financial resource strain: Not on file  . Food insecurity    Worry: Not on file    Inability: Not on file  . Transportation needs    Medical: Not on file    Non-medical: Not on file  Tobacco Use  .  Smoking status: Current Every Day Smoker    Packs/day: 1.50    Years: 10.00    Pack years: 15.00    Types: Cigarettes  . Smokeless tobacco: Never Used  Substance and Sexual Activity  . Alcohol use: No  . Drug use: No  . Sexual activity: Not on file  Lifestyle  . Physical activity    Days per week: Not on file    Minutes per session: Not on file  . Stress: Not on file  Relationships  . Social Musician on phone: Not on file    Gets together: Not on file    Attends religious service: Not on file    Active member of club or organization: Not on file    Attends meetings of clubs or organizations: Not on file    Relationship status: Not on file  Other Topics Concern  . Not on file  Social History Narrative  . Not on file     Family History: The patient's family history is not on file.  ROS:   Please see the history of present illness.     All other systems reviewed and are negative.  EKGs/Labs/Other Studies Reviewed:    The following studies were reviewed today: Cath/PCI  Echo  EKG:  EKG is ordered today.  The ekg ordered today demonstrates NSR with inferior Qs  Recent Labs: 05/28/2019: BUN 12; Creatinine, Ser 0.87; Hemoglobin 15.2; Platelets 202; Potassium 3.9; Sodium 138  Recent Lipid Panel    Component Value Date/Time   CHOL 172 05/27/2019 0220   TRIG 343 (H) 05/27/2019 0220   HDL 29 (L) 05/27/2019 0220   CHOLHDL 5.9 05/27/2019 0220   VLDL 69 (H) 05/27/2019 0220   LDLCALC 74 05/27/2019 0220   LDLDIRECT 106.6 (H) 05/26/2019 0653    Physical Exam:    VS:  BP 118/70   Pulse 78   Temp 98.2 F (36.8 C)   Ht 6\' 2"  (1.88 m)   Wt (!) 325 lb 6.4 oz (147.6 kg)   SpO2 99%   BMI 41.78 kg/m     Wt Readings from Last 3 Encounters:  06/14/19 (!) 325 lb 6.4 oz (147.6 kg)  05/27/19 (!) 337 lb 4.9 oz (153 kg)  04/17/17 (!) 310 lb (140.6 kg)     GEN:  Obese Caucasian male well developed in no acute distress HEENT: Normal NECK: No JVD; No carotid  bruits LYMPHATICS: No lymphadenopathy CARDIAC: RRR, no murmurs, rubs, gallops RESPIRATORY:  Clear to auscultation without rales, wheezing or rhonchi  ABDOMEN: obese Soft, non-tender, non-distended MUSCULOSKELETAL:  No edema; No deformity  SKIN: Warm and dry NEUROLOGIC:  Alert and oriented x 3 PSYCHIATRIC:  Normal  affect   ASSESSMENT:    Acute ST elevation myocardial infarction (STEMI) of inferior wall (HCC) Admitted 05/26/2019 with inferior STEMI Now almost 3 weeks post MI and doing well  CAD -S/P PCI 100% dRCA - DES PCI (Synergy DES 2.5 x 28 - 2.65 mm) 05/26/2019 followed by staged PCI to pLAD focal 90% with DES 05/27/2019 LVF WNL  Tobacco abuse - 2PPD He tells me he has quit  New onset type 2 diabetes mellitus (HCC) He has been scheduled to establish with a PCP  Morbid obesity (HCC) BMI 41- hopefully he will loose significant weight with improved diet and exercise.   PLAN:    OK to go back to work next Monday- no heavy lifting for another two weeks after that.  Check CMET and lipids in 2 months, Dr Herbie BaltimoreHarding in 3 months.   Medication Adjustments/Labs and Tests Ordered: Current medicines are reviewed at length with the patient today.  Concerns regarding medicines are outlined above.  Orders Placed This Encounter  Procedures  . Comprehensive Metabolic Panel (CMET)  . Lipid panel  . EKG 12-Lead   No orders of the defined types were placed in this encounter.   Patient Instructions  Medication Instructions:  Your physician recommends that you continue on your current medications as directed. Please refer to the Current Medication list given to you today. *If you need a refill on your cardiac medications before your next appointment, please call your pharmacy*  Lab Work: Your physician recommends that you return for lab work in: 2 months-CMET, LIPID  If you have labs (blood work) drawn today and your tests are completely normal, you will receive your results only  by: Marland Kitchen. MyChart Message (if you have MyChart) OR . A paper copy in the mail If you have any lab test that is abnormal or we need to change your treatment, we will call you to review the results.  Testing/Procedures: NONE   Follow-Up: At [facility], you and your health needs are our priority.  As part of our continuing mission to provide you with exceptional heart care, we have created designated Provider Care Teams.  These Care Teams include your primary Cardiologist (physician) and Advanced Practice Providers (APPs -  Physician Assistants and Nurse Practitioners) who all work together to provide you with the care you need, when you need it.  Your next appointment:   3 months  The format for your next appointment:   In Person  Provider:   You may see Bryan Lemmaavid Harding, MD or one of the following Advanced Practice Providers on your designated Care Team:    Theodore DemarkRhonda Barrett, PA-C  Joni ReiningKathryn Lawrence, DNP, ANP  Cadence Fransico MichaelFurth, NP   Other Instructions     Signed, Corine ShelterLuke Iliyana Convey, PA-C  06/14/2019 2:43 PM    [facility]

## 2019-06-14 NOTE — Assessment & Plan Note (Signed)
He has been scheduled to establish with a PCP

## 2019-06-14 NOTE — Patient Instructions (Addendum)
Medication Instructions:  Your physician recommends that you continue on your current medications as directed. Please refer to the Current Medication list given to you today. *If you need a refill on your cardiac medications before your next appointment, please call your pharmacy*  Lab Work: Your physician recommends that you return for lab work in: 2 months-CMET, LIPID  If you have labs (blood work) drawn today and your tests are completely normal, you will receive your results only by: Marland Kitchen MyChart Message (if you have MyChart) OR . A paper copy in the mail If you have any lab test that is abnormal or we need to change your treatment, we will call you to review the results.  Testing/Procedures: NONE   Follow-Up: At [facility], you and your health needs are our priority.  As part of our continuing mission to provide you with exceptional heart care, we have created designated Provider Care Teams.  These Care Teams include your primary Cardiologist (physician) and Advanced Practice Providers (APPs -  Physician Assistants and Nurse Practitioners) who all work together to provide you with the care you need, when you need it.  Your next appointment:   3 months  The format for your next appointment:   In Person  Provider:   You may see Glenetta Hew, MD or one of the following Advanced Practice Providers on your designated Care Team:    Rosaria Ferries, PA-C  Jory Sims, DNP, ANP  Cadence Kathlen Mody, NP   Other Instructions

## 2019-06-14 NOTE — Assessment & Plan Note (Signed)
100% dRCA - DES PCI (Synergy DES 2.5 x 28 - 2.65 mm) 05/26/2019 followed by staged PCI to pLAD focal 90% with DES 05/27/2019 LVF WNL

## 2019-06-14 NOTE — Assessment & Plan Note (Signed)
BMI 41 

## 2019-06-14 NOTE — Assessment & Plan Note (Signed)
Admitted 05/26/2019 with inferior STEMI Now almost 3 weeks post MI and doing well

## 2019-06-14 NOTE — Assessment & Plan Note (Signed)
He tells me he has quit

## 2019-06-27 MED FILL — BRILINTA 90 MG TABLET: 90 | 30 days supply | Qty: 60 | Fill #0

## 2019-06-27 MED FILL — NICOTINE 21 MG/24HR PATCH: 21 | 28 days supply | Qty: 28 | Fill #0

## 2019-06-27 MED FILL — ATORVASTATIN 80 MG TABLET: 80 | 30 days supply | Qty: 30 | Fill #0

## 2019-06-27 MED FILL — NITROGLYCERIN 0.4 MG TAB SL: 0.4 | 25 days supply | Qty: 25 | Fill #0

## 2019-06-27 MED FILL — metFORMIN HCL 500 MG TABS: 500 | 30 days supply | Qty: 60 | Fill #0

## 2019-06-27 MED FILL — METOPROLOL TARTRATE 25 MG T: 25 | 30 days supply | Qty: 60 | Fill #0

## 2019-07-01 ENCOUNTER — Other Ambulatory Visit: Payer: Self-pay

## 2019-07-01 ENCOUNTER — Ambulatory Visit: Payer: Self-pay | Attending: Family Medicine | Admitting: Family Medicine

## 2019-07-08 ENCOUNTER — Telehealth: Payer: Self-pay

## 2019-09-14 ENCOUNTER — Ambulatory Visit (INDEPENDENT_AMBULATORY_CARE_PROVIDER_SITE_OTHER): Payer: Self-pay | Admitting: Cardiology

## 2019-09-14 ENCOUNTER — Other Ambulatory Visit: Payer: Self-pay

## 2019-09-14 ENCOUNTER — Encounter: Payer: Self-pay | Admitting: Cardiology

## 2019-09-14 VITALS — BP 155/104 | HR 104 | Temp 97.5°F | Ht 74.0 in | Wt 364.8 lb

## 2019-09-14 DIAGNOSIS — I2119 ST elevation (STEMI) myocardial infarction involving other coronary artery of inferior wall: Secondary | ICD-10-CM

## 2019-09-14 DIAGNOSIS — I10 Essential (primary) hypertension: Secondary | ICD-10-CM

## 2019-09-14 DIAGNOSIS — Z9861 Coronary angioplasty status: Secondary | ICD-10-CM

## 2019-09-14 DIAGNOSIS — E1169 Type 2 diabetes mellitus with other specified complication: Secondary | ICD-10-CM

## 2019-09-14 DIAGNOSIS — E119 Type 2 diabetes mellitus without complications: Secondary | ICD-10-CM

## 2019-09-14 DIAGNOSIS — I2510 Atherosclerotic heart disease of native coronary artery without angina pectoris: Secondary | ICD-10-CM

## 2019-09-14 DIAGNOSIS — E785 Hyperlipidemia, unspecified: Secondary | ICD-10-CM

## 2019-09-14 MED ORDER — ATORVASTATIN CALCIUM 80 MG PO TABS: 80.0000 mg | ORAL_TABLET | Freq: Every day | ORAL | 6 refills | Status: DC

## 2019-09-14 MED ORDER — LOSARTAN POTASSIUM 25 MG PO TABS: 25.0000 mg | ORAL_TABLET | Freq: Every day | ORAL | 3 refills | Status: DC

## 2019-09-14 MED ORDER — CARVEDILOL 6.25 MG PO TABS: 6.2500 mg | ORAL_TABLET | Freq: Two times a day (BID) | ORAL | 3 refills | Status: DC

## 2019-09-14 MED ORDER — CLOPIDOGREL BISULFATE 75 MG PO TABS: 75.0000 mg | ORAL_TABLET | Freq: Every day | ORAL | 3 refills | Status: DC

## 2019-09-14 MED ORDER — METFORMIN HCL 500 MG PO TABS: 500.0000 mg | ORAL_TABLET | Freq: Two times a day (BID) | ORAL | 3 refills | Status: DC

## 2019-09-14 NOTE — Progress Notes (Signed)
Primary Care Provider: Default, Provider, MD Cardiologist: Glenetta Hew, MD Electrophysiologist:   Clinic Note: Chief Complaint  Patient presents with  . Follow-up    Unfortunately stopped most medicines.  . Coronary Artery Disease    No angina    HPI:    GLENN GULLICKSON is a 44 y.o. male with a PMH below who presents today for 25-month follow-up for CAD and other risk factors.Jannette Fogo Lueck was last seen on Jun 14, 2019 by Kerin Ransom, [place] for hospital follow-up.  No recurrent chest pain.  Walking at least 40 minutes twice a day.  Had given up smoking and sodas. --> Prior to his admission, he was smoking almost 2 packs a day and was drinking lots of [facility] and 5-hour energy drinks.  Recent Hospitalizations:  October 1-3, 2020 admitted for inferior STEMI --> was started on Metformin, statin, metoprolol aspirin and Brilinta.  Reviewed  CV studies:    The following studies were reviewed today: (if available, images/films reviewed: From Epic Chart or Care Everywhere) . May 26, 2019: Inferior STEMI--distal RCA 100% (DES PCI Synergy 2.5 mm x 20 mm - 2.6 mm).  RPDA 100% distal embolization.Mid LAD 90%. o Staged PCI of mid LAD-Synergy DES 2.7 mm at 12 mm (2.9 mm).  Widely patent RCA with persistent distal PDA occlusion.    . TTE 05/27/2019: EF 60 to 65%.  Unable to assess wall motion because of poor images.  GR 1 DD.  Both atria are normal size.  Normal valves.   Interval History:   YOHANNES WAIBEL presents here for follow-up just a little bit frustrated the fact that he is put on a lot of weight.  He is happy that he quit smoking, but he is definitely put a lot of weight.  Drinking lots of soda still.  He says the cravings are pretty much started to wane, but he has been pretty much grazing and eating all the time.  Not very active, not as much as he had been.  He also notes that he said that he is having a hard time affording Brilinta and therefore has not really been  taking it regularly or regularly.  He is no longer taking atorvastatin or metoprolol.  He is taking aspirin.  Thankfully, he is not having any further active anginal symptoms.  He is short of breath now because he is more obese and may have a little bit of orthopnea but no PND.  No edema.  No irregular heartbeats/palpitations.  CV Review of Symptoms (Summary): no chest pain or dyspnea on exertion negative for - edema, irregular heartbeat, orthopnea, palpitations, paroxysmal nocturnal dyspnea, rapid heart rate, shortness of breath or Syncope/near syncope, TIA/amaurosis fugax, claudication  The patient does not have symptoms concerning for COVID-19 infection (fever, chills, cough, or new shortness of breath).  The patient is practicing social distancing and masking.  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   REVIEWED OF SYSTEMS   A comprehensive ROS was performed. Review of Systems  Constitutional: Negative for malaise/fatigue (More lazy than anything else.) and weight loss (Significant weight gain).  HENT: Negative for congestion and nosebleeds.   Respiratory: Positive for shortness of breath (A bit more than usual because of increased weight). Negative for wheezing.   Cardiovascular: Positive for leg swelling (Mild bilateral).  Gastrointestinal: Positive for heartburn. Negative for abdominal pain, blood in stool and melena.  Genitourinary: Negative for hematuria.  Musculoskeletal: Negative for falls  and joint pain.  Neurological: Negative for dizziness and focal weakness.  Endo/Heme/Allergies: Negative for environmental allergies. Bruises/bleeds easily.  Psychiatric/Behavioral: Negative for memory loss. The patient is not nervous/anxious and does not have insomnia.    I have reviewed and (if needed) personally updated the patient's problem list, medications, allergies, past medical and surgical history, social and family history.   PAST MEDICAL HISTORY   Past  Medical History:  Diagnosis Date  . Acute ST elevation myocardial infarction (STEMI) of inferior wall (HCC) 05/26/2019   dRCA occluded--DES PCI (distal PDA occluded with distalization.  Codominant circumflex patent.  With staged PCI of 90% mLAD; normal EF on echo  . Coronary artery disease involving native coronary artery of native heart with unstable angina pectoris (HCC) 05/26/2019   a. 05/26/2019 PCI: 100% dRCA - DES PCI (Synergy DES 2.5 x 28 - 2.65 mm); pLAD focal 90%.  Co-dominant LCx, normal; b. 05/27/2019 Staged PCI: mLAD 90% (2.75x12 Synergy DES - 2.689mm).  . Hyperlipidemia associated with type 2 diabetes mellitus (HCC)   . Morbid obesity with BMI of 45.0-49.9, adult (HCC)   . Tobacco abuse - 2PPD 05/26/2019  . Type II diabetes mellitus with complication [facility](HCC)    Not currently taking medications    PAST SURGICAL HISTORY   Past Surgical History:  Procedure Laterality Date  . CORONARY STENT INTERVENTION N/A 05/27/2019   Procedure: CORONARY STENT INTERVENTION;  Surgeon: Marykay LexHarding, Teven Mittman W, MD;  [facility];  Service: Cardiovascular:: Staged PCI of mid LAD-Synergy DES 2.7 mm at 12 mm (2.9 mm).   . CORONARY STENT INTERVENTION N/A 05/26/2019   Procedure: CORONARY STENT INTERVENTION;  Surgeon: Marykay LexHarding, Ritchie Klee W, MD;  [facility];  Service: Cardiovascular:: distal RCA 100% (DES PCI Synergy 2.5 mm x 20 mm - 2.6 mm).  Rosalie Doctor. CORONARY/GRAFT ACUTE MI REVASCULARIZATION N/A 05/26/2019   Procedure: CORONARY/GRAFT ACUTE MI REVASCULARIZATION;  Surgeon: Marykay LexHarding, Khia Dieterich W, MD;  [facility];  Service: Cardiovascular:: PCI of dRCA  . LEFT HEART CATH AND CORONARY ANGIOGRAPHY N/A 05/26/2019   Procedure: LEFT HEART CATH AND CORONARY ANGIOGRAPHY;  Surgeon: Marykay LexHarding, Carrington Olazabal W, MD;  [facility];  Service: Cardiovascular:: dRCA 100% (DES PCI) with dRPDA 100% from distal embolization, mLAD 90% (Staged DES PCI).   . TRANSTHORACIC ECHOCARDIOGRAM  05/27/2019   EF 60  to 65%.  Unable to assess wall motion because of poor images.  GR 1 DD.  Both atria are normal size.  Normal valves.  . WRIST SURGERY      MEDICATIONS/ALLERGIES   Current Meds  Medication Sig  . aspirin 81 MG chewable tablet Chew 1 tablet (81 mg total) by mouth daily.  Marland Kitchen. atorvastatin (LIPITOR) 80 MG tablet Take 1 tablet (80 mg total) by mouth daily at 6 PM.  . metFORMIN (GLUCOPHAGE) 500 MG tablet Take 1 tablet (500 mg total) by mouth 2 (two) times daily with a meal.  . nitroGLYCERIN (NITROSTAT) 0.4 MG SL tablet Place 1 tablet (0.4 mg total) under the tongue every 5 (five) minutes as needed for chest pain (CP or SOB).  . [DISCONTINUED] atorvastatin (LIPITOR) 80 MG tablet Take 1 tablet (80 mg total) by mouth daily at 6 PM.  . [DISCONTINUED] atorvastatin (LIPITOR) 80 MG tablet Take 1 tablet (80 mg total) by mouth daily at 6 PM.  . [DISCONTINUED] metFORMIN (GLUCOPHAGE) 500 MG tablet Take 1 tablet (500 mg total) by mouth 2 (two) times daily with a meal.  . [DISCONTINUED] metFORMIN (  GLUCOPHAGE) 500 MG tablet Take 1 tablet (500 mg total) by mouth 2 (two) times daily with a meal.  . [DISCONTINUED] metoprolol tartrate (LOPRESSOR) 25 MG tablet Take 1 tablet (25 mg total) by mouth 2 (two) times daily.  . [DISCONTINUED] ticagrelor (BRILINTA) 90 MG TABS tablet Take 1 tablet (90 mg total) by mouth 2 (two) times daily.    No Known Allergies   SOCIAL HISTORY/FAMILY HISTORY   Social History   Tobacco Use  . Smoking status: Former Smoker    Packs/day: 1.50    Years: 10.00    Pack years: 15.00    Types: Cigarettes    Quit date: 05/27/2019    Years since quitting: 0.3  . Smokeless tobacco: Never Used  Substance Use Topics  . Alcohol use: No  . Drug use: No   Social History   Social History Narrative   Unfortunately, although he quit smoking, has also started exercising and not monitoring his diet has gained weight.    Family History family history includes CAD in his mother; Cancer in his  father; Diabetes in his brother, father, and mother; Heart attack in his mother.   OBJCTIVE -PE, EKG, labs   Wt Readings from Last 3 Encounters:  09/14/19 (!) 364 lb 12.8 oz (165.5 kg)  06/14/19 (!) 325 lb 6.4 oz (147.6 kg)  05/27/19 (!) 337 lb 4.9 oz (153 kg)    Physical Exam: BP (!) 155/104   Pulse (!) 104   Temp (!) 97.5 F (36.4 C)   Ht 6\' 2"  (1.88 m)   Wt (!) 364 lb 12.8 oz (165.5 kg)   SpO2 97%   BMI 46.84 kg/m  Physical Exam  Constitutional: He appears well-developed. No distress.  Morbidly obese.  Relatively well-groomed  HENT:  Head: Normocephalic and atraumatic.  Neck: No hepatojugular reflux and no JVD (Difficult to assess due to body habitus) present. Carotid bruit is not present.  Cardiovascular: Regular rhythm, S1 normal and S2 normal.  No extrasystoles are present. Tachycardia present. PMI is not displaced (Unable to assess). Exam reveals distant heart sounds and decreased pulses (Difficult to palpate pedal pulses due to of obesity). Exam reveals no gallop and no friction rub.  No murmur heard. Pulmonary/Chest: Effort normal and breath sounds normal. No respiratory distress. He has no wheezes. He has no rales.  Abdominal: Soft. Bowel sounds are normal. He exhibits no distension. There is no abdominal tenderness. There is no rebound.  To obese.  Unable to assess HSM  Musculoskeletal:     Cervical back: Normal range of motion and neck supple.  Vitals reviewed.    Adult ECG Report Not checked  Recent Labs:    Lab Results  Component Value Date   CHOL 172 05/27/2019   HDL 29 (L) 05/27/2019   LDLCALC 74 05/27/2019   LDLDIRECT 106.6 (H) 05/26/2019   TRIG 343 (H) 05/27/2019   CHOLHDL 5.9 05/27/2019   Lab Results  Component Value Date   CREATININE 0.87 05/28/2019   BUN 12 05/28/2019   NA 138 05/28/2019   K 3.9 05/28/2019   CL 106 05/28/2019   CO2 24 05/28/2019    ASSESSMENT/PLAN    Problem List Items Addressed This Visit    ST elevation myocardial  infarction (STEMI) of inferior wall (HCC) (Chronic)    Very young for having had a STEMI with two-vessel disease.  Thankfully no other lesions noted.  Has preserved EF after PCI.  No further symptoms.  Unfortunately he has stopped his medications.  He has quit smoking, but is put on a lot of weight.  We spent at least 10 minutes talking about smoking cessation, his weight gain and lack of exercise as well as need for medical adherence.      Relevant Medications   atorvastatin (LIPITOR) 80 MG tablet   carvedilol (COREG) 6.25 MG tablet   losartan (COZAAR) 25 MG tablet   CAD -S/P PCI - Primary (Chronic)    Two-vessel PCI with no other significant lesions.  Currently only taking aspirin.  Not taking Brilinta regularly.  Cost is an issue.  Plan: Continue aspirin.  We will have him finish up current bottle of Brilinta and switch to Plavix 75 mg daily (loading dose first day is 4 pills - 300 mg)  Continue atorvastatin 80 mg  He has not been taking metoprolol does not have a refill.  Plan will be to convert to carvedilol six-point 25 mg twice daily  Add ARB-losartan 25 mg daily.       Relevant Medications   atorvastatin (LIPITOR) 80 MG tablet   carvedilol (COREG) 6.25 MG tablet   losartan (COZAAR) 25 MG tablet   Other Relevant Orders   Lipid panel   Basic metabolic panel   Hyperlipidemia associated with type 2 diabetes mellitus (HCC) (Chronic)    Currently on (or supposed to be taking atorvastatin 80 mg daily.  We will continue for now.  Needs labs to be checked this week to reassess.  His peri-MI lipids look amazingly good, however I suspect with his significant weight gain that his lipids worsen.      Relevant Medications   atorvastatin (LIPITOR) 80 MG tablet   losartan (COZAAR) 25 MG tablet   metFORMIN (GLUCOPHAGE) 500 MG tablet   Essential hypertension (Chronic)    He is not currently on any medications and blood pressure is very high today with heart rate 104 bpm.  Plan:  Start carvedilol 6.25 mg twice daily and losartan 25 mg daily.  We will need to follow-up with APP in 3 to 4 weeks to reevaluate blood pressure as well as lipids.      Relevant Medications   atorvastatin (LIPITOR) 80 MG tablet   carvedilol (COREG) 6.25 MG tablet   losartan (COZAAR) 25 MG tablet   New onset type 2 diabetes mellitus (HCC) (Chronic)    Diagnosed when he was in the hospital.  Was supposed be started on Metformin.  Stressed the importance of setting up a primary care physician. Will check A1c in follow-up visit.  Continue Metformin for now, but low threshold to consider SGLT2 inhibitor.  Major issue would be cost.      Relevant Medications   atorvastatin (LIPITOR) 80 MG tablet   losartan (COZAAR) 25 MG tablet   metFORMIN (GLUCOPHAGE) 500 MG tablet   Morbid obesity (HCC) (Chronic)    BMI up to 47 from 41 at the time of his MI.  He is put on a lot of weight since smoking cessation. Next we talked about the importance of staying adherent to his smoking cessation plan and once the urges start going away, he then needs to start backing a conscious effort of cutting back his eating, scaling down the snacking and watching what he eats.  He then needs to get back to doing his walking like he was initially post MI.      Relevant Medications   metFORMIN (GLUCOPHAGE) 500 MG tablet       COVID-19 Education: The signs and symptoms of COVID-19 were  discussed with the patient and how to seek care for testing (follow up with PCP or arrange E-visit).   The importance of social distancing was discussed today.  I spent a total of 28 minutes with the patient and chart review. >  50% of the time was spent in direct patient consultation.  Additional time spent with chart review (studies, outside notes, etc): 14  Total Time: 42 min   Current medicines are reviewed at length with the patient today.  (+/- concerns) n/a   Patient Instructions / Medication Changes & Studies & Tests  Ordered   Patient Instructions  Medication Instructions:  Your physician has recommended you make the following change in your medication:   DISCONTINUE YOUR METOPROLOL.  START CARVEDILOL (COREG) 6.25 MG BY MOUTH TWICE A DAY  START LOSARTAN (COZAAR) 25 MG BY MOUTH DAILY  FINISH THE REST OF YOUR BRILINTA. YOU WILL START CLOPIDOGREL (PLAVIX) THE DAY AFTER YOUR LAST DOSE OF BRILINTA.  FOR YOUR FIRST DAY ON PLAVIX, TAKE 4 TABLETS (300 MG) AS A "LOADING DOSE". THEN TAKE ONLY 1 TABLET (75 MG) A DAY FROM THEN ON.   *If you need a refill on your cardiac medications before your next appointment, please call your pharmacy*  Lab Work: Your physician recommends that you return for lab work WITHIN THE NEXT 2 WEEKS: BASIC METABOLIC PANEL FASTING LIPID PROFILE  If you have labs (blood work) drawn today and your tests are completely normal, you will receive your results only by: Marland Kitchen MyChart Message (if you have MyChart) OR . A paper copy in the mail If you have any lab test that is abnormal or we need to change your treatment, we will call you to review the results.  Testing/Procedures: NONE  Follow-Up: At [facility], you and your health needs are our priority.  As part of our continuing mission to provide you with exceptional heart care, we have created designated Provider Care Teams.  These Care Teams include your primary Cardiologist (physician) and Advanced Practice Providers (APPs -  Physician Assistants and Nurse Practitioners) who all work together to provide you with the care you need, when you need it.  Your next appointment:   3-4 week(s)  The format for your next appointment:   In Person  Provider:    Joni Reining, DNP, ANP    Other Instructions  FOLLOW UP WITH DR. Quinterrius Errington IN MAY 2021   Studies Ordered:   Orders Placed This Encounter  Procedures  . Lipid panel  . Basic metabolic panel     Bryan Lemma, M.D., M.S. Interventional Cardiologist   Pager #  [phone] Phone # [phone] [address] [place], [place] [address]   Thank you for choosing Heartcare at [facility]!!

## 2019-09-14 NOTE — Patient Instructions (Addendum)
Medication Instructions:  Your physician has recommended you make the following change in your medication:   DISCONTINUE YOUR METOPROLOL.  START CARVEDILOL (COREG) 6.25 MG BY MOUTH TWICE A DAY  START LOSARTAN (COZAAR) 25 MG BY MOUTH DAILY  FINISH THE REST OF YOUR BRILINTA. YOU WILL START CLOPIDOGREL (PLAVIX) THE DAY AFTER YOUR LAST DOSE OF BRILINTA.  FOR YOUR FIRST DAY ON PLAVIX, TAKE 4 TABLETS (300 MG) AS A "LOADING DOSE". THEN TAKE ONLY 1 TABLET (75 MG) A DAY FROM THEN ON.   *If you need a refill on your cardiac medications before your next appointment, please call your pharmacy*  Lab Work: Your physician recommends that you return for lab work WITHIN THE NEXT 2 WEEKS: BASIC METABOLIC PANEL FASTING LIPID PROFILE  If you have labs (blood work) drawn today and your tests are completely normal, you will receive your results only by: Marland Kitchen MyChart Message (if you have MyChart) OR . A paper copy in the mail If you have any lab test that is abnormal or we need to change your treatment, we will call you to review the results.  Testing/Procedures: NONE  Follow-Up: At [facility], you and your health needs are our priority.  As part of our continuing mission to provide you with exceptional heart care, we have created designated Provider Care Teams.  These Care Teams include your primary Cardiologist (physician) and Advanced Practice Providers (APPs -  Physician Assistants and Nurse Practitioners) who all work together to provide you with the care you need, when you need it.  Your next appointment:   3-4 week(s)  The format for your next appointment:   In Person  Provider:    Joni Reining, DNP, ANP    Other Instructions  FOLLOW UP WITH DR. HARDING IN MAY 2021

## 2019-09-16 ENCOUNTER — Telehealth: Payer: Self-pay | Admitting: Cardiology

## 2019-09-16 ENCOUNTER — Encounter: Payer: Self-pay | Admitting: Cardiology

## 2019-09-16 ENCOUNTER — Other Ambulatory Visit: Payer: Self-pay | Admitting: Cardiology

## 2019-09-16 DIAGNOSIS — E1169 Type 2 diabetes mellitus with other specified complication: Secondary | ICD-10-CM | POA: Insufficient documentation

## 2019-09-16 DIAGNOSIS — E785 Hyperlipidemia, unspecified: Secondary | ICD-10-CM | POA: Insufficient documentation

## 2019-09-16 DIAGNOSIS — I10 Essential (primary) hypertension: Secondary | ICD-10-CM | POA: Insufficient documentation

## 2019-09-16 MED ORDER — METFORMIN HCL 500 MG PO TABS: 500.0000 mg | ORAL_TABLET | Freq: Two times a day (BID) | ORAL | 3 refills | Status: DC

## 2019-09-16 MED ORDER — CARVEDILOL 6.25 MG PO TABS: 6.2500 mg | ORAL_TABLET | Freq: Two times a day (BID) | ORAL | 3 refills | Status: DC

## 2019-09-16 MED ORDER — ATORVASTATIN CALCIUM 80 MG PO TABS: 80.0000 mg | ORAL_TABLET | Freq: Every day | ORAL | 6 refills | Status: DC

## 2019-09-16 MED ORDER — CLOPIDOGREL BISULFATE 75 MG PO TABS: 75.0000 mg | ORAL_TABLET | Freq: Every day | ORAL | 3 refills | Status: DC

## 2019-09-16 MED ORDER — NITROGLYCERIN 0.4 MG SL SUBL: 0.4000 mg | SUBLINGUAL_TABLET | SUBLINGUAL | 3 refills | Status: DC | PRN

## 2019-09-16 MED ORDER — LOSARTAN POTASSIUM 25 MG PO TABS: 25.0000 mg | ORAL_TABLET | Freq: Every day | ORAL | 3 refills | Status: DC

## 2019-09-16 NOTE — Assessment & Plan Note (Addendum)
Diagnosed when he was in the hospital.  Was supposed be started on Metformin.  Stressed the importance of setting up a primary care physician. Will check A1c in follow-up visit.  Continue Metformin for now, but low threshold to consider SGLT2 inhibitor.  Major issue would be cost.

## 2019-09-16 NOTE — Assessment & Plan Note (Signed)
Very young for having had a STEMI with two-vessel disease.  Thankfully no other lesions noted.  Has preserved EF after PCI.  No further symptoms.  Unfortunately he has stopped his medications.  He has quit smoking, but is put on a lot of weight.  We spent at least 10 minutes talking about smoking cessation, his weight gain and lack of exercise as well as need for medical adherence.

## 2019-09-16 NOTE — Assessment & Plan Note (Signed)
Two-vessel PCI with no other significant lesions.  Currently only taking aspirin.  Not taking Brilinta regularly.  Cost is an issue.  Plan: Continue aspirin.  We will have him finish up current bottle of Brilinta and switch to Plavix 75 mg daily (loading dose first day is 4 pills - 300 mg)  Continue atorvastatin 80 mg  He has not been taking metoprolol does not have a refill.  Plan will be to convert to carvedilol six-point 25 mg twice daily  Add ARB-losartan 25 mg daily.

## 2019-09-16 NOTE — Telephone Encounter (Signed)
New Message   *STAT* If patient is at the pharmacy, call can be transferred to refill team.   1. Which medications need to be refilled? (please list name of each medication and dose if known)  atorvastatin (LIPITOR) 80 MG tablet carvedilol (COREG) 6.25 MG tablet clopidogrel (PLAVIX) 75 MG tablet losartan (COZAAR) 25 MG tablet metFORMIN (GLUCOPHAGE) 500 MG tablet nitroGLYCERIN (NITROSTAT) 0.4 MG SL tablet  2. Which pharmacy/location (including street and city if local pharmacy) is medication to be sent to? Karin Golden at [facility] [address] [place] - [address]  3. Do they need a 30 day or 90 day supply? 90 day

## 2019-09-16 NOTE — Assessment & Plan Note (Signed)
BMI up to 47 from 41 at the time of his MI.  He is put on a lot of weight since smoking cessation. Next we talked about the importance of staying adherent to his smoking cessation plan and once the urges start going away, he then needs to start backing a conscious effort of cutting back his eating, scaling down the snacking and watching what he eats.  He then needs to get back to doing his walking like he was initially post MI.

## 2019-09-16 NOTE — Telephone Encounter (Signed)
Pt wanted his medications that were sent to [facility], resent to [facility]. I resent pt's medications that were sent on 09/14/19. Confirmation received.

## 2019-09-16 NOTE — Assessment & Plan Note (Signed)
He is not currently on any medications and blood pressure is very high today with heart rate 104 bpm.  Plan: Start carvedilol 6.25 mg twice daily and losartan 25 mg daily.  We will need to follow-up with APP in 3 to 4 weeks to reevaluate blood pressure as well as lipids.

## 2019-09-16 NOTE — Assessment & Plan Note (Signed)
Currently on (or supposed to be taking atorvastatin 80 mg daily.  We will continue for now.  Needs labs to be checked this week to reassess.  His peri-MI lipids look amazingly good, however I suspect with his significant weight gain that his lipids worsen.

## 2019-10-11 NOTE — Progress Notes (Signed)
Cardiology Office Note   Date:  10/12/2019   ID:  David Bender, DOB 04-21-1976, MRN [id number]  PCP:  Default, Provider, MD  Cardiologist:  Dr. Herbie Baltimore CC: BP follow up   History of Present Illness: David Bender is a 44 y.o. male who presents for ongoing assessment and management of CAD with hx of inferior STEMI 05/2019, with Synergy DES PCI to 100% occluded distal RCA, staged PCI of the mid LAD 90% stenosis with Synergy DES.   His last encounter with Dr. Herbie Baltimore was via telemedicine on 09/14/2019. He has stopped smoking, but was frustrated that he had put on weight. He was having a hard time with the cost of Brilinta and had not been taking it regularly. He was not taking atorvastatin or metoprolol. He was taking ASA daily. He was asymptomatic.   At that encounter, he was counseled on continuing smoking cessation, increasing exercise, and reducing calories. He was switched to Plavix, after reloading. He was to restart atorvastatin. He was converted to coreg 6.25 mg BID. Losartan was added. Follow up labs were planned.   David Bender comes today without any complaints.  He has been medically compliant, he has had no side effects from any of his medication.  His energy level is "so-so" but he has not been very active as far as regular exercise regimen.  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  Past Medical History:  Diagnosis Date  . Acute ST elevation myocardial infarction (STEMI) of inferior wall (HCC) 05/26/2019   dRCA occluded--DES PCI (distal PDA occluded with distalization.  Codominant circumflex patent.  With staged PCI of 90% mLAD; normal EF on echo  . Coronary artery disease involving native coronary artery of native heart with unstable angina pectoris (HCC) 05/26/2019   a. 05/26/2019 PCI: 100% dRCA - DES PCI (Synergy DES 2.5 x 28 - 2.65 mm); pLAD focal 90%.  Co-dominant LCx, normal; b. 05/27/2019 Staged PCI: mLAD 90% (2.75x12 Synergy  DES - 2.62mm).  . Hyperlipidemia associated with type 2 diabetes mellitus (HCC)   . Morbid obesity with BMI of 45.0-49.9, adult (HCC)   . Tobacco abuse - 2PPD 05/26/2019  . Type II diabetes mellitus with complication [facility])    Not currently taking medications    Past Surgical History:  Procedure Laterality Date  . CORONARY STENT INTERVENTION N/A 05/27/2019   Procedure: CORONARY STENT INTERVENTION;  Surgeon: Marykay Lex, MD;  Location: [facility] INVASIVE CV LAB;  Service: Cardiovascular:: Staged PCI of mid LAD-Synergy DES 2.7 mm at 12 mm (2.9 mm).   . CORONARY STENT INTERVENTION N/A 05/26/2019   Procedure: CORONARY STENT INTERVENTION;  Surgeon: Marykay Lex, MD;  Location: [facility] INVASIVE CV LAB;  Service: Cardiovascular:: distal RCA 100% (DES PCI Synergy 2.5 mm x 20 mm - 2.6 mm).  Rosalie Doctor ACUTE MI REVASCULARIZATION N/A 05/26/2019   Procedure: CORONARY/GRAFT ACUTE MI REVASCULARIZATION;  Surgeon: Marykay Lex, MD;  Location: [facility] INVASIVE CV LAB;  Service: Cardiovascular:: PCI of dRCA  . LEFT HEART CATH AND CORONARY ANGIOGRAPHY N/A 05/26/2019   Procedure: LEFT HEART CATH AND CORONARY ANGIOGRAPHY;  Surgeon: Marykay Lex, MD;  Location: [facility] INVASIVE CV LAB;  Service: Cardiovascular:: dRCA 100% (DES PCI) with dRPDA 100% from distal embolization, mLAD 90% (Staged DES PCI).   . TRANSTHORACIC ECHOCARDIOGRAM  05/27/2019   EF 60 to 65%.  Unable to assess wall motion because of poor images.  GR 1  DD.  Both atria are normal size.  Normal valves.  . WRIST SURGERY       Current Outpatient Medications  Medication Sig Dispense Refill  . aspirin 81 MG chewable tablet Chew 1 tablet (81 mg total) by mouth daily. 30 tablet 6  . atorvastatin (LIPITOR) 80 MG tablet Take 1 tablet (80 mg total) by mouth daily at 6 PM. 30 tablet 6  . carvedilol (COREG) 6.25 MG tablet Take 1 tablet (6.25 mg total) by mouth 2 (two) times daily. 180 tablet 3  . clopidogrel (PLAVIX) 75 MG tablet Take 1 tablet (75 mg total) by  mouth daily. 90 tablet 3  . losartan (COZAAR) 25 MG tablet Take 1 tablet (25 mg total) by mouth daily. 90 tablet 3  . metFORMIN (GLUCOPHAGE) 500 MG tablet Take 1 tablet (500 mg total) by mouth 2 (two) times daily with a meal. 60 tablet 3  . nitroGLYCERIN (NITROSTAT) 0.4 MG SL tablet Place 1 tablet (0.4 mg total) under the tongue every 5 (five) minutes as needed for chest pain (CP or SOB). 25 tablet 3   No current facility-administered medications for this visit.    Allergies:   Patient has no known allergies.    Social History:  The patient  reports that he quit smoking about 4 months ago. His smoking use included cigarettes. He has a 15.00 pack-year smoking history. He has never used smokeless tobacco. He reports that he does not drink alcohol or use drugs.   Family History:  The patient's family history includes CAD in his mother; Cancer in his father; Diabetes in his brother, father, and mother; Heart attack in his mother.    ROS: All other systems are reviewed and negative. Unless otherwise mentioned in H&P    PHYSICAL EXAM: VS:  BP 131/87   Pulse 87   Temp (!) 97 F (36.1 C)   Ht 6\' 2"  (1.88 m)   Wt (!) 361 lb (163.7 kg)   SpO2 96%   BMI 46.35 kg/m  , BMI Body mass index is 46.35 kg/m. GEN: Well nourished, well developed, in no acute distress.  Morbidly obese HEENT: normal Neck: no JVD, carotid bruits, or masses Cardiac: RRR; no murmurs, rubs, or gallops,no edema  Respiratory:  Clear to auscultation bilaterally, normal work of breathing GI: soft, nontender, nondistended, + BS MS: no deformity or atrophy Skin: warm and dry, no rash Neuro:  Strength and sensation are intact Psych: euthymic mood, full affect   EKG: Not completed this office visit  Recent Labs: 05/28/2019: BUN 12; Creatinine, Ser 0.87; Hemoglobin 15.2; Platelets 202; Potassium 3.9; Sodium 138    Lipid Panel    Component Value Date/Time   CHOL 172 05/27/2019 0220   TRIG 343 (H) 05/27/2019 0220    HDL 29 (L) 05/27/2019 0220   CHOLHDL 5.9 05/27/2019 0220   VLDL 69 (H) 05/27/2019 0220   LDLCALC 74 05/27/2019 0220   LDLDIRECT 106.6 (H) 05/26/2019 0653      Wt Readings from Last 3 Encounters:  10/12/19 (!) 361 lb (163.7 kg)  09/14/19 (!) 364 lb 12.8 oz (165.5 kg)  06/14/19 (!) 325 lb 6.4 oz (147.6 kg)      Other studies Reviewed: Left Heart Cath 05/26/2019  CULPRIT LESION: Mid RCA to Dist RCA lesion is 100% stenosed.  A drug-eluting stent was successfully placed using a STENT SYNERGY DES 2.5X28.--2.65 mm  Post intervention, there is a 0% residual stenosis.  RPDA lesion is 100% stenosed. Distalization  -------------------------------------------  Mid  LAD lesion is 90% stenosed.  -------------------------------------------  The left ventricular systolic function is normal. The left ventricular ejection fraction is 50-55% by visual estimate. LV end diastolic pressure is moderately elevated.   SUMMARY  Codominant system with Left Posterolateral branches and right PDA  Severe two-vessel CAD: 100% Mid-distalRCA occluded (successful DES PCI with Synergy DES 2.5 mm x 28 mm--2.65 mm) and residual distal embolization to the PDA occluding the distal quarter of the PDA, 90% mid focal LAD lesion at D2  Preserved EF with distal inferoapical hypokinesis and moderate to severely elevated EDP of 20 to 23 mmHg (Acute Diastolic Heart Failure).  Staged PCI 05/27/2019  Mid LAD lesion is 90% stenosed.  Previously placed Mid RCA to Dist RCA drug eluting stent is widely patent.  RPDA lesion is 100% stenosed -stable from 05/26/2019  Post intervention, there is a 0% residual stenosis.  A drug-eluting stent was successfully placed using a STENT SYNERGY DES 2.75X12.   SUMMARY  Successful DES PCI of mid LAD with synergy DES 2.75 mm a 12 mm (2.9 mm)  Patent RCA stent from 05/26/2019, but persistent occlusion of the distal PDA (sidebranches are perfusing better)  Echocardiogram  05/27/2019 1. Left ventricular ejection fraction, by visual estimation, is 60 to  65%. The left ventricle has normal function. There is no left ventricular  hypertrophy. Inadequate for regional WMA's.  2. Left ventricular diastolic Doppler parameters are consistent with  impaired relaxation pattern of LV diastolic filling.  3. Global right ventricle has normal systolic function.The right  ventricular size is normal. No increase in right ventricular wall  thickness.  4. Left atrial size was normal.  5. Right atrial size was normal.  6. The mitral valve is grossly normal. Trace mitral valve regurgitation.  7. The tricuspid valve is grossly normal. Tricuspid valve regurgitation  was not visualized by color flow Doppler.  8. The aortic valve is tricuspid Aortic valve regurgitation was not  visualized by color flow Doppler.  9. The pulmonic valve was grossly normal. Pulmonic valve regurgitation is  not visualized by color flow Doppler.   ASSESSMENT AND PLAN:  1.  Coronary artery disease: Multivessel as above.  Stent to the LAD along with mid and distal RCA.  He is now on optimal medical therapy with dual antiplatelet therapy Plavix and aspirin.  He is able to afford this and has not had any problems taking his medication or having any side effects of bleeding or melena.  He is also taking carvedilol 6.25 mg twice daily as directed.  2.  Hypertension: Excellent control of blood pressure today.  Continue losartan and carvedilol as directed.  3.  Hyperlipidemia: Continue atorvastatin 80 mg daily.  He will need to have fasting lipids and LFTs on next office follow-up.  Goal of LDL less than 70.  4.  Non-insulin-dependent diabetes: He remains on Metformin.  He does not routinely check fingerstick blood sugars.  He does have a monitor at home.  I have asked him to do this periodically so that he can keep up with his blood sugars at home.  Labs are followed by PCP.   Current medicines are  reviewed at length with the patient today.    Labs/ tests ordered today include: None   Phill Myron. West Pugh, ANP, AACC   10/12/2019 3:54 PM    [facility] [address] Office [phone] Fax [phone]  Notice: This dictation was prepared with Dragon dictation along with smaller phrase technology.  Any transcriptional errors that result from this process are unintentional and may not be corrected upon review.

## 2019-10-12 ENCOUNTER — Encounter: Payer: Self-pay | Admitting: Adult Health

## 2019-10-12 ENCOUNTER — Other Ambulatory Visit: Payer: Self-pay

## 2019-10-12 ENCOUNTER — Ambulatory Visit (INDEPENDENT_AMBULATORY_CARE_PROVIDER_SITE_OTHER): Payer: Self-pay | Admitting: Adult Health

## 2019-10-12 ENCOUNTER — Encounter (INDEPENDENT_AMBULATORY_CARE_PROVIDER_SITE_OTHER): Payer: Self-pay

## 2019-10-12 VITALS — BP 131/87 | HR 87 | Temp 97.0°F | Ht 74.0 in | Wt 361.0 lb

## 2019-10-12 DIAGNOSIS — E1169 Type 2 diabetes mellitus with other specified complication: Secondary | ICD-10-CM

## 2019-10-12 DIAGNOSIS — E119 Type 2 diabetes mellitus without complications: Secondary | ICD-10-CM

## 2019-10-12 DIAGNOSIS — I2510 Atherosclerotic heart disease of native coronary artery without angina pectoris: Secondary | ICD-10-CM

## 2019-10-12 DIAGNOSIS — E785 Hyperlipidemia, unspecified: Secondary | ICD-10-CM

## 2019-10-12 DIAGNOSIS — I10 Essential (primary) hypertension: Secondary | ICD-10-CM

## 2019-10-12 NOTE — Patient Instructions (Signed)
Medication Instructions:  Continue current medications  *If you need a refill on your cardiac medications before your next appointment, please call your pharmacy*  Lab Work: None Ordered  Testing/Procedures: None Ordered  Follow-Up: At [facility], you and your health needs are our priority.  As part of our continuing mission to provide you with exceptional heart care, we have created designated Provider Care Teams.  These Care Teams include your primary Cardiologist (physician) and Advanced Practice Providers (APPs -  Physician Assistants and Nurse Practitioners) who all work together to provide you with the care you need, when you need it.  Your next appointment:   Keep appointment with Dr Herbie Baltimore May 20th @ 3:00 pm

## 2020-01-12 ENCOUNTER — Encounter: Payer: Self-pay | Admitting: Cardiology

## 2020-01-12 ENCOUNTER — Ambulatory Visit (INDEPENDENT_AMBULATORY_CARE_PROVIDER_SITE_OTHER): Payer: Self-pay | Admitting: Cardiology

## 2020-01-12 ENCOUNTER — Other Ambulatory Visit: Payer: Self-pay

## 2020-01-12 VITALS — BP 102/72 | HR 94 | Ht 74.0 in | Wt 348.2 lb

## 2020-01-12 DIAGNOSIS — I2510 Atherosclerotic heart disease of native coronary artery without angina pectoris: Secondary | ICD-10-CM

## 2020-01-12 DIAGNOSIS — I2119 ST elevation (STEMI) myocardial infarction involving other coronary artery of inferior wall: Secondary | ICD-10-CM

## 2020-01-12 DIAGNOSIS — E1169 Type 2 diabetes mellitus with other specified complication: Secondary | ICD-10-CM

## 2020-01-12 DIAGNOSIS — E119 Type 2 diabetes mellitus without complications: Secondary | ICD-10-CM

## 2020-01-12 DIAGNOSIS — E785 Hyperlipidemia, unspecified: Secondary | ICD-10-CM

## 2020-01-12 DIAGNOSIS — Z9861 Coronary angioplasty status: Secondary | ICD-10-CM

## 2020-01-12 DIAGNOSIS — I10 Essential (primary) hypertension: Secondary | ICD-10-CM

## 2020-01-12 DIAGNOSIS — Z720 Tobacco use: Secondary | ICD-10-CM

## 2020-01-12 MED ORDER — ATORVASTATIN CALCIUM 80 MG PO TABS: 80.0000 mg | ORAL_TABLET | Freq: Every day | ORAL | 3 refills | Status: DC

## 2020-01-12 NOTE — Patient Instructions (Signed)
Medication Instructions:  NO CHNAGES  *If you need a refill on your cardiac medications before your next appointment, please call your pharmacy*   Lab Work: Labs CMP CBC HGBA1C LIPID If you have labs (blood work) drawn today and your tests are completely normal, you will receive your results only by: Marland Kitchen MyChart Message (if you have MyChart) OR . A paper copy in the mail If you have any lab test that is abnormal or we need to change your treatment, we will call you to review the results.   Testing/Procedures: NOT NEEDED   Follow-Up: At [facility], you and your health needs are our priority.  As part of our continuing mission to provide you with exceptional heart care, we have created designated Provider Care Teams.  These Care Teams include your primary Cardiologist (physician) and Advanced Practice Providers (APPs -  Physician Assistants and Nurse Practitioners) who all work together to provide you with the care you need, when you need it.  We recommend signing up for the patient portal called "MyChart".  Sign up information is provided on this After Visit Summary.  MyChart is used to connect with patients for Virtual Visits (Telemedicine).  Patients are able to view lab/test results, encounter notes, upcoming appointments, etc.  Non-urgent messages can be sent to your provider as well.   To learn more about what you can do with MyChart, go to [email or web address].    Your next appointment:   5 month(s)  The format for your next appointment:   In Person  Provider:   Bryan Lemma, MD   Other instructions  Please follow up finding a primary doctor - to follow diabetes
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
## 2020-01-15 ENCOUNTER — Encounter: Payer: Self-pay | Admitting: Cardiology

## 2020-01-15 NOTE — Assessment & Plan Note (Signed)
Status post PCI to mid RCA and LAD and staged fashion.  Otherwise really did not have significant disease.  There was some distal embolization in the PDA that is probably resolved.  Preserved EF on echo.  No recurrent anginal symptoms.  Doing well.  Is on stable regimen of aspirin Plavix statin beta-blocker and ARB.  Plan:   Continue DAPT for now-plan will be 1 year uninterrupted aspirin/Plavix until October 2021  As of October 2021, would be okay to hold Plavix 5-7 days preop for surgeries or procedures.  (Prior to that time, would need to be urgent or emergent surgery)  Continue carvedilol and losartan at current dose.  Continue high-dose high intensity statin until lipids are better controlled.  Continue dietary adjustments and increase exercise for weight loss.  Congratulated on his efforts of smoking cessation.

## 2020-01-15 NOTE — Assessment & Plan Note (Signed)
He is fully quit now.  Had some initial issues with cravings, but seems to be doing better the further out he gets.  He is now over the initial weight gain spell of smoking cessation, and is now try to lose weight.  I congratulated his efforts.

## 2020-01-15 NOTE — Assessment & Plan Note (Signed)
He is due for lab check.  We will check an A1c as well.  He is on Metformin.

## 2020-01-15 NOTE — Assessment & Plan Note (Signed)
Again on high-dose high intensity statin.  Restarted back in January.  Was supposed to have labs checked, but they did not get rechecked. Plan: We will check fasting lipid panel as well as CMP, A1c and CBC.

## 2020-01-15 NOTE — Assessment & Plan Note (Signed)
Blood pressure looks much better on carvedilol and losartan.  No change.  Has not had any lightheaded spells.

## 2020-01-18 NOTE — Telephone Encounter (Signed)
CREATED IN ERROR

## 2020-02-03 ENCOUNTER — Other Ambulatory Visit: Payer: Self-pay | Admitting: Cardiology

## 2020-02-22 LAB — LIPID PANEL
Chol/HDL Ratio: 3.8 ratio (ref 0.0–5.0)
Cholesterol, Total: 96 mg/dL — ABNORMAL LOW (ref 100–199)
HDL: 25 mg/dL — ABNORMAL LOW (ref >39)
LDL Chol Calc (NIH): 24 mg/dL (ref 0–99)
Triglycerides: 320 mg/dL — ABNORMAL HIGH (ref 0–149)
VLDL Cholesterol Cal: 47 mg/dL — ABNORMAL HIGH (ref 5–40)

## 2020-02-22 LAB — CBC
Hematocrit: 43.6 % (ref 37.5–51.0)
Hemoglobin: 14.9 g/dL (ref 13.0–17.7)
MCH: 30.4 pg (ref 26.6–33.0)
MCHC: 34.2 g/dL (ref 31.5–35.7)
MCV: 89 fL (ref 79–97)
Platelets: 219 10*3/uL (ref 150–450)
RBC: 4.9 x10E6/uL (ref 4.14–5.80)
RDW: 12.9 % (ref 11.6–15.4)
WBC: 7.3 10*3/uL (ref 3.4–10.8)

## 2020-02-22 LAB — HEMOGLOBIN A1C
Est. average glucose Bld gHb Est-mCnc: 114 mg/dL
Hgb A1c MFr Bld: 5.6 % (ref 4.8–5.6)

## 2020-02-22 LAB — COMPREHENSIVE METABOLIC PANEL
ALT: 32 IU/L (ref 0–44)
AST: 18 IU/L (ref 0–40)
Albumin/Globulin Ratio: 1.8 (ref 1.2–2.2)
Albumin: 4.4 g/dL (ref 4.0–5.0)
Alkaline Phosphatase: 59 IU/L (ref 48–121)
BUN/Creatinine Ratio: 17 (ref 9–20)
BUN: 16 mg/dL (ref 6–24)
Bilirubin Total: 0.2 mg/dL (ref 0.0–1.2)
CO2: 23 mmol/L (ref 20–29)
Calcium: 9.4 mg/dL (ref 8.7–10.2)
Chloride: 102 mmol/L (ref 96–106)
Creatinine, Ser: 0.92 mg/dL (ref 0.76–1.27)
GFR calc Af Amer: 119 mL/min/{1.73_m2} (ref >59)
GFR calc non Af Amer: 103 mL/min/{1.73_m2} (ref >59)
Globulin, Total: 2.5 g/dL (ref 1.5–4.5)
Glucose: 113 mg/dL — ABNORMAL HIGH (ref 65–99)
Potassium: 4.7 mmol/L (ref 3.5–5.2)
Sodium: 142 mmol/L (ref 134–144)
Total Protein: 6.9 g/dL (ref 6.0–8.5)

## 2020-03-01 ENCOUNTER — Other Ambulatory Visit: Payer: Self-pay

## 2020-03-01 ENCOUNTER — Telehealth: Payer: Self-pay | Admitting: Cardiology

## 2020-03-01 MED ORDER — ICOSAPENT ETHYL 1 G PO CAPS: 2.0000 g | ORAL_CAPSULE | Freq: Two times a day (BID) | ORAL | 3 refills | Status: DC

## 2020-03-01 MED ORDER — ATORVASTATIN CALCIUM 40 MG PO TABS: 40.0000 mg | ORAL_TABLET | Freq: Every day | ORAL | 1 refills | Status: DC

## 2020-03-01 MED ORDER — ICOSAPENT ETHYL 1 G PO CAPS: 2.0000 g | ORAL_CAPSULE | Freq: Two times a day (BID) | ORAL | 0 refills | Status: DC

## 2020-03-01 NOTE — Telephone Encounter (Signed)
-----   Message from Marykay Lex, MD sent at 02/27/2020  5:20 PM EDT ----- Labs show that total cholesterol looks great as does the LDL.  HDL is low, likely related to genetics.  Interesting that the triglycerides are still high. -->  Plan: Reduce atorvastatin to 40 mg daily and start Vascepa.  New Rx Vascepa 1mg  tab -2 tab p.o. twice daily -> 90 day supply (360 tab), 3 refills.  , MD

## 2020-03-01 NOTE — Telephone Encounter (Signed)
Patient called regarding a new prescription that was sent in today. He stated that icosapent Ethyl (VASCEPA) 1 g capsule was sent to the wrong pharmacy, and that it needed to be sent to [facility] at [facility] [address] [place] - [address]. He states that is the only pharmacy where he gets his prescriptions from.

## 2020-03-01 NOTE — Telephone Encounter (Signed)
Sent RX to pharmacy requested.

## 2020-03-01 NOTE — Telephone Encounter (Signed)
Called patient- advised of lab work. Changed medications, sent in new RX. Patient verbalized understanding.

## 2020-03-01 NOTE — Telephone Encounter (Signed)
Patient is calling to get lab results from labs that were done on 6/29. Please call.

## 2020-03-01 NOTE — Telephone Encounter (Addendum)
RN  discussed with patient about patient assistance the information below. He does not have insurance. Patient states he will come to office and pick up  Samples an free saving card.  patient also states he will follow up  with  [employer]  Saving card -- free 30 day supply  and samples    Patient Assistance:  The [employer] foundation offers assistance to help pay for medication copays.  They will cover copays for all cholesterol lowering meds, including statins, fibrates, omega-3 oils, ezetimibe, Repatha, Praluent, Nexletol, Nexlizet.  The cards are usually good for $2,500 or 12 months, whichever comes first. 1. Go to [employer]ation.org 2. Click on "Apply Now" 3. Answer questions as to whom is applying (patient or representative) 4. Your disease fund will be "hypercholesterolemia - Medicare access" 5. They will ask questions about finances and which medications you are taking for cholesterol 6. When you submit, the approval is usually within minutes.  You will need to print the card information from the site 7. You will need to show this information to your pharmacy, they will bill your Medicare Part D plan first -then bill [employer] --for the copay.   You can also call them at [phone], although the hold times can be quite long.

## 2020-06-13 ENCOUNTER — Ambulatory Visit: Payer: Self-pay | Admitting: Cardiology

## 2020-06-14 ENCOUNTER — Ambulatory Visit (INDEPENDENT_AMBULATORY_CARE_PROVIDER_SITE_OTHER): Payer: Self-pay | Admitting: Cardiology

## 2020-06-14 ENCOUNTER — Encounter: Payer: Self-pay | Admitting: Cardiology

## 2020-06-14 ENCOUNTER — Other Ambulatory Visit: Payer: Self-pay

## 2020-06-14 VITALS — BP 124/96 | HR 95 | Ht 74.0 in | Wt 357.0 lb

## 2020-06-14 DIAGNOSIS — E119 Type 2 diabetes mellitus without complications: Secondary | ICD-10-CM

## 2020-06-14 DIAGNOSIS — I2510 Atherosclerotic heart disease of native coronary artery without angina pectoris: Secondary | ICD-10-CM

## 2020-06-14 DIAGNOSIS — E785 Hyperlipidemia, unspecified: Secondary | ICD-10-CM

## 2020-06-14 DIAGNOSIS — Z9861 Coronary angioplasty status: Secondary | ICD-10-CM

## 2020-06-14 DIAGNOSIS — I2119 ST elevation (STEMI) myocardial infarction involving other coronary artery of inferior wall: Secondary | ICD-10-CM

## 2020-06-14 DIAGNOSIS — Z720 Tobacco use: Secondary | ICD-10-CM

## 2020-06-14 DIAGNOSIS — E1169 Type 2 diabetes mellitus with other specified complication: Secondary | ICD-10-CM

## 2020-06-14 DIAGNOSIS — I10 Essential (primary) hypertension: Secondary | ICD-10-CM

## 2020-06-14 MED ORDER — CARVEDILOL 6.25 MG PO TABS: 9.3750 mg | ORAL_TABLET | Freq: Two times a day (BID) | ORAL | 3 refills | Status: DC

## 2020-06-14 MED ORDER — FISH OIL 1000 MG PO CAPS: 3000.0000 mg | ORAL_CAPSULE | Freq: Every day | ORAL | 3 refills | Status: DC

## 2020-06-14 NOTE — Progress Notes (Signed)
Primary Care Provider: Default, Provider, MD Cardiologist: Bryan Lemma, MD Electrophysiologist: None  Clinic Note: Chief Complaint  Patient presents with  . Follow-up    Had lost all the way down to 300 pounds, but gained back because of back injury get inferomedially continue to do exercise.  . Coronary Artery Disease    No active angina or heart failure    HPI:    David Bender is a very pleasant, morbidly obese 44 y.o. male with a  PMH notable for Inferior STEMI (CAD-RCA PCI, October 2020), DM-2, HTN, HLD who presents today for follow-up.   Problem List Items Addressed This Visit    ST elevation myocardial infarction (STEMI) of inferior wall (HCC) (Chronic)    1 year out now from having inferior STEMI with two-vessel CAD and PCI.  No other significant lesions, preserved EF on echo. He is now taking his medications regularly with exception of Vascepa which he cannot afford.  We are converting just over-the-counter fish oil.      Relevant Medications   carvedilol (COREG) 6.25 MG tablet   Other Relevant Orders   EKG 12-Lead (Completed)   CAD -S/P PCI - Primary (Chronic)    Status post PCI of the RCA and LAD in a staged fashion following his MI.  Reviewed the only major lesions noted.  No other significant disease noted.  At the time of his MI he had just normalization of the PDA which probably resolved since there was no significant wall motion abnormality noted on echo.  He is now a year out from his MI/PCI.  We have converted him already to clopidogrel from Brilinta.  Plan: Okay to stop aspirin  Continue clopidogrel 75 mg daily;   okay to hold 5 days preop for procedures or surgeries.    (Would like to replace with 81 mg ASA in lieu of clopidogrel).  Continue current dose of atorvastatin, but because of financial issues will replace Vascepa with OTC fish oil.  Continue current dose of losartan, and increase carvedilol to 9.375 mg twice daily.        Relevant  Medications   carvedilol (COREG) 6.25 MG tablet   Tobacco abuse - 2PPD (Chronic)    He is doing very well.  No further cigarettes.  I congratulated his efforts.      Hyperlipidemia associated with type 2 diabetes mellitus (HCC) (Chronic)    He is on 40 mg atorvastatin, tolerating it well.  Last lipids look great with exception of triglycerides.  I suspect this is probably related to his weight.  We will try to start Vascepa, but if he does not have insurance through work to help pay for medications and therefore was not able to afford Vascepa.  He was not actually taking it plan labs were checked.  We will convert to fish oil 3 g daily.  Continue current dose statin. Should be due to recheck lipids by the end of the year.      Relevant Orders   Lipid panel   Comprehensive metabolic panel   Essential hypertension (Chronic)    Blood pressure looks okay, but heart rate still 96 bpm.  Plan: Increase carvedilol to 9.375 mg twice daily with goal to eventually increase to 12.5 mg twice daily  Continue low-dose losartan      Relevant Medications   carvedilol (COREG) 6.25 MG tablet   Other Relevant Orders   EKG 12-Lead (Completed)   New onset type 2 diabetes mellitus (HCC) (Chronic)  Most recent A1c as of June was 5.6.  This is on Metformin alone.  I suspect we will need to follow-up since he does not have a PCP listed.      Morbid obesity (HCC) (Chronic)    Unfortunately, he took up step back with his weight loss, having gained most of it back.  Hopefully once his back is fully healed, he will be to get into his exercise again.  Also talked importance of dietary modification.       Other Visit Diagnoses    Coronary artery disease involving native coronary artery of native heart without angina pectoris       Relevant Medications   carvedilol (COREG) 6.25 MG tablet   Other Relevant Orders   EKG 12-Lead (Completed)   Lipid panel   Comprehensive metabolic panel    . David Bender was last seen on Jan 12, 2020.  He was in great spirits with at least 20 pound weight loss having increase his exercise level (walking and jogging, along with elliptical trainer/cycling) as well as trying to adjust his diet.  No active cardiac symptoms.  Recent Hospitalizations: None  Reviewed  CV studies:    The following studies were reviewed today: (if available, images/films reviewed: From Epic Chart or Care Everywhere) . None:  Interval History:   David Bender returns here today overall doing quite well cardiac standpoint.  Denies any chest pain or dyspnea at rest or exertion besides expected deconditioning related dyspnea.  He was doing really really well with weight loss, diet and exercise until he through out his back and injured his leg at work doing heavy lifting.  He has been out of exercise now for several months, and unfortunately has gained weight.  He says is still hard for him to not smoke, but he has not had a cigarette since his MI.  He is also cut back on alcohol as well as sodas.  CV Review of Symptoms (Summary) Cardiovascular ROS: no chest pain or dyspnea on exertion positive for - Little exercise intolerance as he is now just back into exercising after injuring his back. negative for - edema, irregular heartbeat, orthopnea, palpitations, paroxysmal nocturnal dyspnea, rapid heart rate, shortness of breath or Syncope/near syncope, TIA/amaurosis fugax, claudication  The patient does not have symptoms concerning for COVID-19 infection (fever, chills, cough, or new shortness of breath).   REVIEWED OF SYSTEMS   Review of Systems  Constitutional: Negative for malaise/fatigue and weight loss (Unfortunately gained back weight because of back injury).  HENT: Negative for congestion and nosebleeds.   Respiratory: Negative for cough and shortness of breath.   Gastrointestinal: Negative for abdominal pain, blood in stool and melena.  Genitourinary: Negative for hematuria.   Musculoskeletal: Positive for back pain. Negative for joint pain.  Neurological: Negative for dizziness, focal weakness and headaches.  Psychiatric/Behavioral: Negative.    I have reviewed and (if needed) personally updated the patient's problem list, medications, allergies, past medical and surgical history, social and family history.   PAST MEDICAL HISTORY   Past Medical History:  Diagnosis Date  . Acute ST elevation myocardial infarction (STEMI) of inferior wall (HCC) 05/26/2019   dRCA occluded--DES PCI (distal PDA occluded with distalization.  Codominant circumflex patent.  With staged PCI of 90% mLAD; normal EF on echo  . Coronary artery disease involving native coronary artery of native heart with unstable angina pectoris (HCC) 05/26/2019   a. 05/26/2019 PCI: 100% dRCA - DES PCI (Synergy DES 2.5  x 28 - 2.65 mm); pLAD focal 90%.  Co-dominant LCx, normal; b. 05/27/2019 Staged PCI: mLAD 90% (2.75x12 Synergy DES - 2.529mm).  . Hyperlipidemia associated with type 2 diabetes mellitus (HCC)   . Morbid obesity with BMI of 45.0-49.9, adult (HCC)   . Tobacco abuse - 2PPD 05/26/2019  . Type II diabetes mellitus with complication [facility](HCC)    Not currently taking medications    PAST SURGICAL HISTORY   Past Surgical History:  Procedure Laterality Date  . CORONARY STENT INTERVENTION N/A 05/27/2019   Procedure: CORONARY STENT INTERVENTION;  Surgeon: Marykay LexHarding, Ritu Gagliardo W, MD;  [facility];  Service: Cardiovascular:: Staged PCI of mid LAD-Synergy DES 2.7 mm at 12 mm (2.9 mm).   . CORONARY STENT INTERVENTION N/A 05/26/2019   Procedure: CORONARY STENT INTERVENTION;  Surgeon: Marykay LexHarding, Jerelle Virden W, MD;  [facility];  Service: Cardiovascular:: distal RCA 100% (DES PCI Synergy 2.5 mm x 20 mm - 2.6 mm).  Rosalie Doctor. CORONARY/GRAFT ACUTE MI REVASCULARIZATION N/A 05/26/2019   Procedure: CORONARY/GRAFT ACUTE MI REVASCULARIZATION;  Surgeon: Marykay LexHarding, Antwuan Eckley W, MD;  [facility];  Service:  Cardiovascular:: PCI of dRCA  . LEFT HEART CATH AND CORONARY ANGIOGRAPHY N/A 05/26/2019   Procedure: LEFT HEART CATH AND CORONARY ANGIOGRAPHY;  Surgeon: Marykay LexHarding, Shannon Balthazar W, MD;  [facility];  Service: Cardiovascular:: dRCA 100% (DES PCI) with dRPDA 100% from distal embolization, mLAD 90% (Staged DES PCI).   . TRANSTHORACIC ECHOCARDIOGRAM  05/27/2019   EF 60 to 65%.  Unable to assess wall motion because of poor images.  GR 1 DD.  Both atria are normal size.  Normal valves.  . WRIST SURGERY      May 26, 2019: Inferior STEMI--distal RCA 100% (DES PCI Synergy 2.5 mm x 20 mm - 2.6 mm). RPDA 100% distal embolization.Mid LAD 90%. ? Staged PCI of mid LAD-Synergy DES 2.7 mm at 12 mm (2.9 mm). Widely patent RCA with persistent distal PDA occlusion.    Immunization History  Administered Date(s) Administered  . Tdap 06/03/2017    He is at his COVID-19 vaccine injections (just forgot his card today)  MEDICATIONS/ALLERGIES   Current Meds  Medication Sig  . aspirin 81 MG chewable tablet Chew 1 tablet (81 mg total) by mouth daily.  Marland Kitchen. atorvastatin (LIPITOR) 40 MG tablet Take 1 tablet (40 mg total) by mouth daily at 6 PM.  . carvedilol (COREG) 6.25 MG tablet Take 1.5 tablets (9.375 mg total) by mouth 2 (two) times daily.  . clopidogrel (PLAVIX) 75 MG tablet Take 1 tablet (75 mg total) by mouth daily.  Marland Kitchen. icosapent Ethyl (VASCEPA) 1 g capsule Take 2 capsules (2 g total) by mouth 2 (two) times daily.  Marland Kitchen. icosapent Ethyl (VASCEPA) 1 g capsule Take 2 capsules (2 g total) by mouth 2 (two) times daily.  Marland Kitchen. losartan (COZAAR) 25 MG tablet Take 1 tablet (25 mg total) by mouth daily.  . metFORMIN (GLUCOPHAGE) 500 MG tablet TAKE ONE TABLET BY MOUTH TWICE A DAY WITH MEALS  . nitroGLYCERIN (NITROSTAT) 0.4 MG SL tablet Place 1 tablet (0.4 mg total) under the tongue every 5 (five) minutes as needed for chest pain (CP or SOB).  . [DISCONTINUED] carvedilol (COREG) 6.25 MG tablet Take 1 tablet (6.25 mg  total) by mouth 2 (two) times daily.    No Known Allergies  SOCIAL HISTORY/FAMILY HISTORY   Reviewed in Epic:  Pertinent findings: still working with [employer]; Shortly after the  May visit, he injured his back at work lifting heavy equipment, and was not able to exercise for the last several months.  He lost all the way down to 330 pounds and gained most of it back.  OBJCTIVE -PE, EKG, labs   Wt Readings from Last 3 Encounters:  06/14/20 (!) 357 lb (161.9 kg)  01/12/20 (!) 348 lb 3.2 oz (157.9 kg)  10/12/19 (!) 361 lb (163.7 kg)    Physical Exam: BP (!) 124/96   Pulse 95   Ht  (1.88 m)   Wt (!) 357 lb (161.9 kg)   BMI 45.84 kg/m  Physical Exam Vitals reviewed.  Constitutional:      General: He is not in acute distress.    Appearance: Normal appearance. He is not ill-appearing.     Comments: Morbidly obese.  Wearing his work uniform  HENT:     Head: Normocephalic and atraumatic.  Neck:     Vascular: No carotid bruit, hepatojugular reflux or JVD.  Cardiovascular:     Rate and Rhythm: Normal rate and regular rhythm.  No extrasystoles are present.    Chest Wall: PMI is not displaced (Unable to palpate).     Pulses: Normal pulses.     Heart sounds: S1 normal and S2 normal. Heart sounds not distant. No murmur heard.  No friction rub. No gallop.   Pulmonary:     Effort: Pulmonary effort is normal. No respiratory distress.     Breath sounds: Normal breath sounds.     Comments: Distant breath sounds Chest:     Chest wall: No tenderness.  Musculoskeletal:        General: Swelling (Trivial 1+ bilateral LE) present. Normal range of motion.     Cervical back: Normal range of motion and neck supple.  Neurological:     General: No focal deficit present.     Mental Status: He is alert and oriented to person, place, and time.  Psychiatric:        Mood and Affect: Mood normal.        Behavior: Behavior normal.        Thought Content: Thought content normal.          Judgment: Judgment normal.     Adult ECG Report N/A  Recent Labs:    Lab Results  Component Value Date   CHOL 96 (L) 02/21/2020   HDL 25 (L) 02/21/2020   LDLCALC 24 02/21/2020   LDLDIRECT 106.6 (H) 05/26/2019   TRIG 320 (H) 02/21/2020   CHOLHDL 3.8 02/21/2020   Lab Results  Component Value Date   CREATININE 0.92 02/21/2020   BUN 16 02/21/2020   NA 142 02/21/2020   K 4.7 02/21/2020   CL 102 02/21/2020   CO2 23 02/21/2020   No results found for: TSH  ASSESSMENT/PLAN    Problem List Items Addressed This Visit    ST elevation myocardial infarction (STEMI) of inferior wall (HCC) (Chronic)    1 year out now from having inferior STEMI with two-vessel CAD and PCI.  No other significant lesions, preserved EF on echo. He is now taking his medications regularly with exception of Vascepa which he cannot afford.  We are converting just over-the-counter fish oil.      Relevant Medications   carvedilol (COREG) 6.25 MG tablet   Other Relevant Orders   EKG 12-Lead (Completed)   CAD -S/P PCI - Primary (Chronic)    Status post PCI of the RCA and LAD in a staged fashion  following his MI.  Reviewed the only major lesions noted.  No other significant disease noted.  At the time of his MI he had just normalization of the PDA which probably resolved since there was no significant wall motion abnormality noted on echo.  He is now a year out from his MI/PCI.  We have converted him already to clopidogrel from Brilinta.  Plan: Okay to stop aspirin  Continue clopidogrel 75 mg daily;   okay to hold 5 days preop for procedures or surgeries.    (Would like to replace with 81 mg ASA in lieu of clopidogrel).  Continue current dose of atorvastatin, but because of financial issues will replace Vascepa with OTC fish oil.  Continue current dose of losartan, and increase carvedilol to 9.375 mg twice daily.        Relevant Medications   carvedilol (COREG) 6.25 MG tablet   Tobacco abuse -  2PPD (Chronic)    He is doing very well.  No further cigarettes.  I congratulated his efforts.      Hyperlipidemia associated with type 2 diabetes mellitus (HCC) (Chronic)    He is on 40 mg atorvastatin, tolerating it well.  Last lipids look great with exception of triglycerides.  I suspect this is probably related to his weight.  We will try to start Vascepa, but if he does not have insurance through work to help pay for medications and therefore was not able to afford Vascepa.  He was not actually taking it plan labs were checked.  We will convert to fish oil 3 g daily.  Continue current dose statin. Should be due to recheck lipids by the end of the year.      Relevant Orders   Lipid panel   Comprehensive metabolic panel   Essential hypertension (Chronic)    Blood pressure looks okay, but heart rate still 96 bpm.  Plan: Increase carvedilol to 9.375 mg twice daily with goal to eventually increase to 12.5 mg twice daily  Continue low-dose losartan      Relevant Medications   carvedilol (COREG) 6.25 MG tablet   Other Relevant Orders   EKG 12-Lead (Completed)   New onset type 2 diabetes mellitus (HCC) (Chronic)    Most recent A1c as of June was 5.6.  This is on Metformin alone.  I suspect we will need to follow-up since he does not have a PCP listed.      Morbid obesity (HCC) (Chronic)    Unfortunately, he took up step back with his weight loss, having gained most of it back.  Hopefully once his back is fully healed, he will be to get into his exercise again.  Also talked importance of dietary modification.       Other Visit Diagnoses    Coronary artery disease involving native coronary artery of native heart without angina pectoris       Relevant Medications   carvedilol (COREG) 6.25 MG tablet   Other Relevant Orders   EKG 12-Lead (Completed)   Lipid panel   Comprehensive metabolic panel      COVID-19 Education: The signs and symptoms of COVID-19 were discussed  with the patient and how to seek care for testing (follow up with PCP or arrange E-visit).   The importance of social distancing and COVID-19 vaccination was discussed today. 1 min The patient is practicing social distancing & Masking.   I spent a total of with the patient spent in direct patient consultation.  Additional time spent with  chart review  / charting (studies, outside notes, etc): 8 Total Time: 34 min   Current medicines are reviewed at length with the patient today.  (+/- concerns) n/a  This visit occurred during the SARS-CoV-2 public health emergency.  Safety protocols were in place, including screening questions prior to the visit, additional usage of staff PPE, and extensive cleaning of exam room while observing appropriate contact time as indicated for disinfecting solutions.  Notice: This dictation was prepared with Dragon dictation along with smaller phrase technology. Any transcriptional errors that result from this process are unintentional and may not be corrected upon review.  Patient Instructions / Medication Changes & Studies & Tests Ordered   Patient Instructions  Medication Instructions:   changes  since you are able to afford Vascepa  3 grams of fish oil  Daily - may purchase  Over the counter    Carvedilol            Twice a day  ( 1 and 1/2 tablets  Of 6.25 mg )  *If you need a refill on your cardiac medications before your next appointment, please call your pharmacy*   Lab Work: Not needed If you have labs (blood work) drawn today and your tests are completely normal, you will receive your results only by: Marland Kitchen MyChart Message (if you have MyChart) OR . A paper copy in the mail If you have any lab test that is abnormal or we need to change your treatment, we will call you to review the results.   Testing/Procedures: Not needed   Follow-Up: At [facility], you and your health needs are our priority.  As part of our continuing mission to  provide you with exceptional heart care, we have created designated Provider Care Teams.  These Care Teams include your primary Cardiologist (physician) and Advanced Practice Providers (APPs -  Physician Assistants and Nurse Practitioners) who all work together to provide you with the care you need, when you need it.  We recommend signing up for the patient portal called "MyChart".  Sign up information is provided on this After Visit Summary.  MyChart is used to connect with patients for Virtual Visits (Telemedicine).  Patients are able to view lab/test results, encounter notes, upcoming appointments, etc.  Non-urgent messages can be sent to your provider as well.   To learn more about what you can do with MyChart, go to [email or web address].    Your next appointment:   6 month(s)  The format for your next appointment:   In Person  Provider:   Bryan Lemma, MD   Other Instructions Congrats on not smoking     Studies Ordered:   Orders Placed This Encounter  Procedures  . Lipid panel  . Comprehensive metabolic panel  . EKG 12-Lead     Bryan Lemma, M.D., M.S. Interventional Cardiologist   Pager # [phone] Phone # [phone] [address] [place], [place] [address]   Thank you for choosing [facility] at [facility]!!

## 2020-06-14 NOTE — Patient Instructions (Addendum)
Medication Instructions:   changes  since you are able to afford Vascepa  3 grams of fish oil  Daily - may purchase  Over the counter    Carvedilol            Twice a day  ( 1 and 1/2 tablets  Of 6.25 mg )  *If you need a refill on your cardiac medications before your next appointment, please call your pharmacy*   Lab Work: Not needed If you have labs (blood work) drawn today and your tests are completely normal, you will receive your results only by: Marland Kitchen MyChart Message (if you have MyChart) OR . A paper copy in the mail If you have any lab test that is abnormal or we need to change your treatment, we will call you to review the results.   Testing/Procedures: Not needed   Follow-Up: At [facility], you and your health needs are our priority.  As part of our continuing mission to provide you with exceptional heart care, we have created designated Provider Care Teams.  These Care Teams include your primary Cardiologist (physician) and Advanced Practice Providers (APPs -  Physician Assistants and Nurse Practitioners) who all work together to provide you with the care you need, when you need it.  We recommend signing up for the patient portal called "MyChart".  Sign up information is provided on this After Visit Summary.  MyChart is used to connect with patients for Virtual Visits (Telemedicine).  Patients are able to view lab/test results, encounter notes, upcoming appointments, etc.  Non-urgent messages can be sent to your provider as well.   To learn more about what you can do with MyChart, go to [email or web address].    Your next appointment:   6 month(s)  The format for your next appointment:   In Person  Provider:   Bryan Lemma, MD   Other Instructions Congrats on not smoking

## 2020-06-20 ENCOUNTER — Encounter: Payer: Self-pay | Admitting: Cardiology

## 2020-06-20 NOTE — Assessment & Plan Note (Signed)
He is on 40 mg atorvastatin, tolerating it well.  Last lipids look great with exception of triglycerides.  I suspect this is probably related to his weight.  We will try to start Vascepa, but if he does not have insurance through work to help pay for medications and therefore was not able to afford Vascepa.  He was not actually taking it plan labs were checked.  We will convert to fish oil 3 g daily.  Continue current dose statin. Should be due to recheck lipids by the end of the year.

## 2020-06-20 NOTE — Assessment & Plan Note (Signed)
1 year out now from having inferior STEMI with two-vessel CAD and PCI.  No other significant lesions, preserved EF on echo. He is now taking his medications regularly with exception of Vascepa which he cannot afford.  We are converting just over-the-counter fish oil.

## 2020-06-20 NOTE — Assessment & Plan Note (Signed)
Status post PCI of the RCA and LAD in a staged fashion following his MI.  Reviewed the only major lesions noted.  No other significant disease noted.  At the time of his MI he had just normalization of the PDA which probably resolved since there was no significant wall motion abnormality noted on echo.  He is now a year out from his MI/PCI.  We have converted him already to clopidogrel from Brilinta.  Plan: Okay to stop aspirin  Continue clopidogrel 75 mg daily;   okay to hold 5 days preop for procedures or surgeries.    (Would like to replace with 81 mg ASA in lieu of clopidogrel).  Continue current dose of atorvastatin, but because of financial issues will replace Vascepa with OTC fish oil.  Continue current dose of losartan, and increase carvedilol to 9.375 mg twice daily.

## 2020-06-20 NOTE — Assessment & Plan Note (Signed)
Unfortunately, he took up step back with his weight loss, having gained most of it back.  Hopefully once his back is fully healed, he will be to get into his exercise again.  Also talked importance of dietary modification.

## 2020-06-20 NOTE — Assessment & Plan Note (Signed)
Blood pressure looks okay, but heart rate still 96 bpm.  Plan: Increase carvedilol to 9.375 mg twice daily with goal to eventually increase to 12.5 mg twice daily  Continue low-dose losartan

## 2020-06-20 NOTE — Assessment & Plan Note (Signed)
He is doing very well.  No further cigarettes.  I congratulated his efforts.

## 2020-06-20 NOTE — Assessment & Plan Note (Signed)
Most recent A1c as of June was 5.6.  This is on Metformin alone.  I suspect we will need to follow-up since he does not have a PCP listed.

## 2020-09-10 ENCOUNTER — Telehealth: Payer: Self-pay | Admitting: Cardiology

## 2020-09-10 ENCOUNTER — Other Ambulatory Visit: Payer: Self-pay | Admitting: Cardiology

## 2020-09-10 NOTE — Telephone Encounter (Signed)
*  STAT* If patient is at the pharmacy, call can be transferred to refill team.   1. Which medications need to be refilled? (please list name of each medication and dose if known)  carvedilol (COREG) 6.25 MG tablet  2. Which pharmacy/location (including street and city if local pharmacy) is medication to be sent to? Karin Golden at [facility] [address] [place] - [id number]  3. Do they need a 30 day or 90 day supply? 90 with refills   Patient said he only uses Karin Golden at [facility] [address] [place] - [id number]   . Patient is out of medication

## 2020-09-11 ENCOUNTER — Telehealth: Payer: Self-pay | Admitting: Cardiology

## 2020-09-11 MED ORDER — CARVEDILOL 6.25 MG PO TABS: 9.3750 mg | ORAL_TABLET | Freq: Two times a day (BID) | ORAL | 3 refills | Status: DC

## 2020-09-11 NOTE — Telephone Encounter (Signed)
*  STAT* If patient is at the pharmacy, call can be transferred to refill team.   1. Which medications need to be refilled? (please list name of each medication and dose if known) need a new prescription for his Carvedilol- pt said it had changed  2. Which pharmacy/location (including street and city if local pharmacy) is medication to be sent to? [facility] [phone]  3. Do they need a 30 day or 90 day supply? 90 days and refills

## 2020-10-01 ENCOUNTER — Other Ambulatory Visit: Payer: Self-pay | Admitting: Cardiology

## 2020-10-08 NOTE — Telephone Encounter (Signed)
The prescription is 6.25 mg tablets;  1-1/2 tablets twice daily -> for 14-month supply that would be 270 tablets.  With 3 refills.  RX: Carvedilol 6.25 mg tablets, Sig :  take 1-1/2 tablets 2 times daily, Disp #270 tab; 3 refills   Bryan Lemma, MD

## 2020-11-21 ENCOUNTER — Other Ambulatory Visit: Payer: Self-pay | Admitting: Cardiology

## 2020-11-22 NOTE — Telephone Encounter (Signed)
LEFT MESSAGE FOR PATIENT TO RETURN PHONE CALL.    PATIENT WILL NEED TO OBTAIN PRIMARY OR ENDOCRINOLOGIST TO CONTINUE PRESCRIBING MEDICATION .   ( QUESTION IF PATIENT HAS PRIMARY NOW IF SO WE WILL NEED TO SEND PRESCRIPTION FOR PRIMARY TO FILL. IF NOT, WILL REFILL UNTIL PATIENT SEE DR Herbie Baltimore IN MAY 2022    awaiting for patient to  return call

## 2020-11-23 NOTE — Telephone Encounter (Signed)
LEFT MESSAGE FOR PATIENT , WILL FILL MEDICATION UNTIL NEXT APPOINTMENT WITH DR Herbie Baltimore - Jan 03, 2021. THEY CAN DISCUSS IN PLACING A REFERRAL FOR PRIMARY OR ENDOCRINOLOGIST

## 2020-11-23 NOTE — Telephone Encounter (Signed)
   Pt is returning call, he said he doesn't have a a primary care provider and requested to get a callback from nurse

## 2021-01-03 ENCOUNTER — Ambulatory Visit (INDEPENDENT_AMBULATORY_CARE_PROVIDER_SITE_OTHER): Payer: Self-pay | Admitting: Cardiology

## 2021-01-03 ENCOUNTER — Encounter: Payer: Self-pay | Admitting: Cardiology

## 2021-01-03 ENCOUNTER — Other Ambulatory Visit: Payer: Self-pay

## 2021-01-03 VITALS — BP 142/98 | HR 87 | Ht 74.0 in | Wt 386.6 lb

## 2021-01-03 DIAGNOSIS — E785 Hyperlipidemia, unspecified: Secondary | ICD-10-CM

## 2021-01-03 DIAGNOSIS — I10 Essential (primary) hypertension: Secondary | ICD-10-CM

## 2021-01-03 DIAGNOSIS — E1169 Type 2 diabetes mellitus with other specified complication: Secondary | ICD-10-CM

## 2021-01-03 DIAGNOSIS — Z9861 Coronary angioplasty status: Secondary | ICD-10-CM

## 2021-01-03 DIAGNOSIS — I2119 ST elevation (STEMI) myocardial infarction involving other coronary artery of inferior wall: Secondary | ICD-10-CM

## 2021-01-03 DIAGNOSIS — Z87891 Personal history of nicotine dependence: Secondary | ICD-10-CM

## 2021-01-03 DIAGNOSIS — I2510 Atherosclerotic heart disease of native coronary artery without angina pectoris: Secondary | ICD-10-CM

## 2021-01-03 MED ORDER — METFORMIN HCL 500 MG PO TABS: 1.0000 | ORAL_TABLET | Freq: Two times a day (BID) | ORAL | 2 refills | Status: DC

## 2021-01-03 MED ORDER — CARVEDILOL 12.5 MG PO TABS: 12.5000 mg | ORAL_TABLET | Freq: Two times a day (BID) | ORAL | 3 refills | Status: DC

## 2021-01-03 NOTE — Patient Instructions (Signed)
Medication Instructions:  Stop taking Aspirin  Increase Carvedilol 12.5 mg twice a day  ( may take 2 tablets of 6.25 mg twice a day until bottle is empty)  *If you need a refill on your cardiac medications before your next appointment, please call your pharmacy*   Lab Work:  Labs in May or June Lipid cmp Cbc hgba1c  Oct 2022 The same labs - fasting   If you have labs (blood work) drawn today and your tests are completely normal, you will receive your results only by: Marland Kitchen MyChart Message (if you have MyChart) OR . A paper copy in the mail If you have any lab test that is abnormal or we need to change your treatment, we will call you to review the results.   Testing/Procedures: Not needed   Follow-Up: At [facility], you and your health needs are our priority.  As part of our continuing mission to provide you with exceptional heart care, we have created designated Provider Care Teams.  These Care Teams include your primary Cardiologist (physician) and Advanced Practice Providers (APPs -  Physician Assistants and Nurse Practitioners) who all work together to provide you with the care you need, when you need it.  We recommend signing up for the patient portal called "MyChart".  Sign up information is provided on this After Visit Summary.  MyChart is used to connect with patients for Virtual Visits (Telemedicine).  Patients are able to view lab/test results, encounter notes, upcoming appointments, etc.  Non-urgent messages can be sent to your provider as well.   To learn more about what you can do with MyChart, go to [email or web address].    Your next appointment:   6 month(s)  The format for your next appointment:   In Person  Provider:   You will see one of the following Advanced Practice Providers on your designated Care Team:    Theodore Demark, PA-C  Joni Reining, DNP, ANP  Then, Bryan Lemma, MD will plan to see you again in 12 month(s).

## 2021-01-03 NOTE — Progress Notes (Signed)
Primary Care Provider: Default, Provider, MD Cardiologist: Bryan Lemma, MD Electrophysiologist: None  Clinic Note: Chief Complaint  Patient presents with  . Follow-up    Delayed 31-month  . Coronary Artery Disease    No angina.   ===================================  ASSESSMENT/PLAN   Problem List Items Addressed This Visit    ST elevation myocardial infarction (STEMI) of inferior wall (HCC) (Chronic)    Just about 18 months out from his inferior STEMI.  Doing well with no recurrent issues.  No active angina or heart failure symptoms.  He is happy to acknowledge that he has quit smoking.  Still needs to work on diet and exercise, but felt like smoking cessation was more important at that time.  Is now trying to focus on diet and exercise.  Tolerating current medications without any issues.      Relevant Medications   carvedilol (COREG) 12.5 MG tablet   Other Relevant Orders   EKG 12-Lead (Completed)   Lipid panel   Comprehensive metabolic panel   Hemoglobin A1c   CBC   Lipid panel   Comprehensive metabolic panel   CBC   Hemoglobin A1c   CAD -S/P PCI - Primary (Chronic)    DES PCI to 100% and occluded distal RCA followed by staged PCI to the 90% LAD prior to discharge.  There was some distal embolization occluding the apical PDA at the time of his initial infarct.  No significant wall motion normality on follow-up echo would indicate that this did recanalized.  Plan:  DC aspirin.  Increase carvedilol to 12.5 mg twice daily and continue losartan 25 mg daily.  Continue 4 mg daily atorvastatin.  Will continue maintenance Plavix/clopidogrel 75 mg daily based on two-vessel CAD.  Okay to hold Plavix 5 days preop for routine surgeries in 7 days preop for neuro/spinal procedures.      Relevant Medications   carvedilol (COREG) 12.5 MG tablet   Other Relevant Orders   EKG 12-Lead (Completed)   Lipid panel   Comprehensive metabolic panel   Hemoglobin A1c   CBC    Lipid panel   Comprehensive metabolic panel   CBC   Hemoglobin A1c   Former heavy tobacco smoker (Chronic)    He now is fully quit smoking, but as a result has gained quite a bit of weight.  He has no desire now to smoke, to the point where he actually does not like the smell of smoke, where previously he had craves cigarettes.      Hyperlipidemia associated with type 2 diabetes mellitus (HCC) (Chronic)    Tolerating atorvastatin 80 mg daily.  Labs as of June last year looked outstanding with LDL of 24.  He had an A1c of 5.6.  He has gained quite a bit of weight since that time and I am wondering/worrying if his labs do not look as good.  He is taking metformin, and we can consider SGLT2 inhibitor or GLP-1 agonist with his obesity if he were able to qualify for financial assistance.  Plan: For now continue current medications including metformin and 40 mg atorvastatin.  Follow-up labs in June, and then again in October: FLP, CMP, CBC and A1c.  We discussed the importance of trying to work on changing his diet and increasing exercise.  Smoking cessation was what set him off.      Relevant Medications   metFORMIN (GLUCOPHAGE) 500 MG tablet   Other Relevant Orders   EKG 12-Lead (Completed)   Lipid panel  Comprehensive metabolic panel   Hemoglobin A1c   CBC   Lipid panel   Comprehensive metabolic panel   CBC   Hemoglobin A1c   Essential hypertension (Chronic)    BP still little bit elevated and heart rate is 87 bpm.  Plan:  Further titrate carvedilol to 12.5 mg twice daily.  Continue losartan 25 mg daily.      Relevant Medications   carvedilol (COREG) 12.5 MG tablet   Other Relevant Orders   EKG 12-Lead (Completed)   Lipid panel   Comprehensive metabolic panel   Hemoglobin A1c   CBC   Lipid panel   Comprehensive metabolic panel   CBC   Hemoglobin A1c   Morbid obesity (HCC) (Chronic)    Still gaining weight.  A little bit far along to blame it fully on smoking  cessation.  At this time, he needs to take more responsibility with his weight.  We talked about adjusting his diet.  Perhaps we could refer him to [facility].  He may also benefit of GLP-1 agonist.      Relevant Medications   metFORMIN (GLUCOPHAGE) 500 MG tablet   Other Relevant Orders   EKG 12-Lead (Completed)   Lipid panel   Comprehensive metabolic panel   Hemoglobin A1c   CBC   Lipid panel   Comprehensive metabolic panel   CBC   Hemoglobin A1c      ===================================  HPI:    David Bender is a 45 y.o. male with a PMH notable for inferior STEMI (RCA PCI in October 2020), DM-2, HTN, HLD and obesity who presents today for 43-month Follow-up  David Bender was last seen on June 14, 2020  Recent Hospitalizations: None  Reviewed  CV studies:    The following studies were reviewed today: (if available, images/films reviewed: From Epic Chart or Care Everywhere) . None:   Interval History:   David Bender presents here today for 52-month follow-up stating he is doing pretty well.  He is very happy about quitting cigarettes, but he has gained a lot of weight.  He is may be getting a little bit more than usual exertional dyspnea because of all the weight gain.  He realizes that he really needs to lose weight.  His back in bed bothering a lot from an injury at work lifting something too heavy.  He was then trying to help this out by using an upside down hanging table and twisted his knee try to get back up again.  As such, he really has not been doing any activity in the last month or 2 which is made it hard to lose weight.  He is still pretty active with work although he is now limited by his knee and back.  With what ever he does, he does note a little bit of exertional dyspnea now that his weight is up but has not had any chest pain or pressure.  No PND, orthopnea or edema.  CardiovascularReview of Symptoms (Summary): positive for - dyspnea on  exertion and Maybe a little swelling in his ankles when he is on his feet all day long.  Exertional dyspnea is because of deconditioning and weight gain. negative for - chest pain, irregular heartbeat, orthopnea, palpitations, paroxysmal nocturnal dyspnea, rapid heart rate, shortness of breath or Lightheadedness, dizziness or wooziness, syncope/near syncope or TIA/amaurosis fugax, claudication   REVIEWED OF SYSTEMS   Review of Systems  Constitutional: Positive for malaise/fatigue. Negative for fever and weight loss (  Significant weight gain).  HENT: Negative for congestion and nosebleeds.   Respiratory: Positive for shortness of breath (More short of breath because of deconditioning and obesity). Negative for cough (No longer has any smokers cough.).   Gastrointestinal: Negative for abdominal pain, blood in stool and melena.  Genitourinary: Negative for frequency and hematuria.  Musculoskeletal: Positive for back pain and joint pain (Left knee pain, he twisted his knee-see above).  Neurological: Negative for dizziness and focal weakness.  Psychiatric/Behavioral: Negative for depression and memory loss. The patient does not have insomnia.    I have reviewed and (if needed) personally updated the patient's problem list, medications, allergies, past medical and surgical history, social and family history.   PAST MEDICAL HISTORY   Past Medical History:  Diagnosis Date  . Acute ST elevation myocardial infarction (STEMI) of inferior wall (HCC) 05/26/2019   dRCA occluded--DES PCI (distal PDA occluded with distalization.  Codominant circumflex patent.  With staged PCI of 90% mLAD; normal EF on echo  . Coronary artery disease involving native coronary artery of native heart with unstable angina pectoris (HCC) 05/26/2019   a. 05/26/2019 PCI: 100% dRCA - DES PCI (Synergy DES 2.5 x 28 - 2.65 mm); pLAD focal 90%.  Co-dominant LCx, normal; b. 05/27/2019 Staged PCI: mLAD 90% (2.75x12 Synergy DES - 2.83mm).  .  Hyperlipidemia associated with type 2 diabetes mellitus (HCC)   . Morbid obesity with BMI of 45.0-49.9, adult (HCC)   . Tobacco abuse - 2PPD 05/26/2019  . Type II diabetes mellitus with complication [facility])    Not currently taking medications    PAST SURGICAL HISTORY   Past Surgical History:  Procedure Laterality Date  . CORONARY STENT INTERVENTION N/A 05/27/2019   Procedure: CORONARY STENT INTERVENTION;  Surgeon: Marykay Lex, MD;  Location: [facility] INVASIVE CV LAB;  Service: Cardiovascular:: Staged PCI of mid LAD-Synergy DES 2.7 mm at 12 mm (2.9 mm).   . CORONARY STENT INTERVENTION N/A 05/26/2019   Procedure: CORONARY STENT INTERVENTION;  Surgeon: Marykay Lex, MD;  Location: [facility] INVASIVE CV LAB;  Service: Cardiovascular:: distal RCA 100% (DES PCI Synergy 2.5 mm x 20 mm - 2.6 mm).  Rosalie Doctor ACUTE MI REVASCULARIZATION N/A 05/26/2019   Procedure: CORONARY/GRAFT ACUTE MI REVASCULARIZATION;  Surgeon: Marykay Lex, MD;  Location: [facility] INVASIVE CV LAB;  Service: Cardiovascular:: PCI of dRCA  . LEFT HEART CATH AND CORONARY ANGIOGRAPHY N/A 05/26/2019   Procedure: LEFT HEART CATH AND CORONARY ANGIOGRAPHY;  Surgeon: Marykay Lex, MD;  Location: [facility] INVASIVE CV LAB;  Service: Cardiovascular:: dRCA 100% (DES PCI) with dRPDA 100% from distal embolization, mLAD 90% (Staged DES PCI).   . TRANSTHORACIC ECHOCARDIOGRAM  05/27/2019   EF 60 to 65%.  Unable to assess wall motion because of poor images.  GR 1 DD.  Both atria are normal size.  Normal valves.  . WRIST SURGERY      May 26, 2019: Inferior STEMI--distal RCA 100% (DES PCI Synergy 2.5 mm x 20 mm - 2.6 mm). RPDA 100% distal embolization.Mid LAD 90%. ? Staged PCI of mid LAD-Synergy DES 2.7 mm at 12 mm (2.9 mm). Widely patent RCA with persistent distal PDA occlusion.    Immunization History  Administered Date(s) Administered  . Tdap 06/03/2017    MEDICATIONS/ALLERGIES   Current Meds  Medication Sig  . atorvastatin (LIPITOR) 40  MG tablet Take 1 tablet (40 mg total) by mouth daily at 6 PM.  . carvedilol (COREG) 12.5 MG tablet Take 1 tablet (12.5 mg total)  by mouth 2 (two) times daily.  . clopidogrel (PLAVIX) 75 MG tablet TAKE ONE TABLET BY MOUTH DAILY  . losartan (COZAAR) 25 MG tablet TAKE ONE TABLET BY MOUTH DAILY  . nitroGLYCERIN (NITROSTAT) 0.4 MG SL tablet Place 1 tablet (0.4 mg total) under the tongue every 5 (five) minutes as needed for chest pain (CP or SOB).  . Omega-3 Fatty Acids (FISH OIL) 1000 MG CAPS Take 3 capsules (3,000 mg total) by mouth daily at 12 noon.  . [DISCONTINUED] aspirin 81 MG chewable tablet Chew 1 tablet (81 mg total) by mouth daily.  . [DISCONTINUED] carvedilol (COREG) 6.25 MG tablet Take 1.5 tablets (9.375 mg total) by mouth 2 (two) times daily.  . [DISCONTINUED] icosapent Ethyl (VASCEPA) 1 g capsule Take 2 capsules (2 g total) by mouth 2 (two) times daily.  . [DISCONTINUED] icosapent Ethyl (VASCEPA) 1 g capsule Take 2 capsules (2 g total) by mouth 2 (two) times daily.  . [DISCONTINUED] metFORMIN (GLUCOPHAGE) 500 MG tablet TAKE ONE TABLET BY MOUTH TWICE A DAY WITH MEALS    No Known Allergies  SOCIAL HISTORY/FAMILY HISTORY   Reviewed in Epic:  Pertinent findings:  Social History   Tobacco Use  . Smoking status: Former Smoker    Packs/day: 1.50    Years: 10.00    Pack years: 15.00    Types: Cigarettes    Quit date: 05/27/2019    Years since quitting: 1.6  . Smokeless tobacco: Never Used  Vaping Use  . Vaping Use: Never used  Substance Use Topics  . Alcohol use: No  . Drug use: No   Social History   Social History Narrative   Unfortunately, although he quit smoking, has also started exercising and not monitoring his diet has gained weight.    OBJCTIVE -PE, EKG, labs   Wt Readings from Last 3 Encounters:  01/03/21 (!) 386 lb 9.6 oz (175.4 kg)  06/14/20 (!) 357 lb (161.9 kg)  01/12/20 (!) 348 lb 3.2 oz (157.9 kg)    Physical Exam: BP (!) 142/98   Pulse 87   Ht 6'  2" (1.88 m)   Wt (!) 386 lb 9.6 oz (175.4 kg)   SpO2 98%   BMI 49.64 kg/m  Physical Exam Vitals reviewed.  Constitutional:      General: He is not in acute distress.    Appearance: Normal appearance. He is not ill-appearing or toxic-appearing.     Comments: Now super morbidly obese.  Well-groomed.    HENT:     Head: Normocephalic and atraumatic.  Neck:     Vascular: No carotid bruit or JVD (Unable to assess).  Cardiovascular:     Rate and Rhythm: Normal rate and regular rhythm.  No extrasystoles are present.    Chest Wall: PMI is not displaced (Unable to palpate).     Pulses: Decreased pulses (Decreased because of body habitus.).     Heart sounds: S1 normal and S2 normal. Heart sounds are distant. No murmur heard. No friction rub. No gallop.   Pulmonary:     Effort: Pulmonary effort is normal. No respiratory distress.     Breath sounds: No wheezing, rhonchi or rales.     Comments: Somewhat distant breath sounds because of body habitus. Chest:     Chest wall: No tenderness.  Musculoskeletal:        General: No swelling. Normal range of motion.     Cervical back: Normal range of motion and neck supple.  Skin:    General: Skin  is warm and dry.  Neurological:     General: No focal deficit present.     Mental Status: He is alert and oriented to person, place, and time.     Gait: Gait abnormal (Antalgic gait, favoring left knee.).  Psychiatric:        Behavior: Behavior normal.        Thought Content: Thought content normal.        Judgment: Judgment normal.     Comments: Pleasant mood and affect.     Adult ECG Report  Rate: 87 ;  Rhythm: normal sinus rhythm and Borderline left axis deviation.  Inferior MI, age-indeterminate.  Cannot rule out anterior MI, age-indeterminate.;   Narrative Interpretation: Stable.  Previously was read as left axis deviation.  Recent Labs: Has not had labs since June of last year.  Due for recheck now. Lab Results  Component Value Date   CHOL  96 (L) 02/21/2020   HDL 25 (L) 02/21/2020   LDLCALC 24 02/21/2020   LDLDIRECT 106.6 (H) 05/26/2019   TRIG 320 (H) 02/21/2020   CHOLHDL 3.8 02/21/2020   Lab Results  Component Value Date   CREATININE 0.92 02/21/2020   BUN 16 02/21/2020   NA 142 02/21/2020   K 4.7 02/21/2020   CL 102 02/21/2020   CO2 23 02/21/2020   CBC Latest Ref Rng & Units 02/21/2020 05/28/2019 05/27/2019  WBC 3.4 - 10.8 x10E3/uL 7.3 11.8(H) 12.0(H)  Hemoglobin 13.0 - 17.7 g/dL 16.1 09.6 04.5  Hematocrit 37.5 - 51.0 % 43.6 42.9 44.8  Platelets 150 - 450 x10E3/uL 219 202 210    No results found for: TSH  ==================================================  COVID-19 Education: The signs and symptoms of COVID-19 were discussed with the patient and how to seek care for testing (follow up with PCP or arrange E-visit).    I spent a total of 24 minutes with the patient spent in direct patient consultation.  Additional time spent with chart review  / charting (studies, outside notes, etc): 9 min Total Time: 33 min  Current medicines are reviewed at length with the patient today.  (+/- concerns) N/A  This visit occurred during the SARS-CoV-2 public health emergency.  Safety protocols were in place, including screening questions prior to the visit, additional usage of staff PPE, and extensive cleaning of exam room while observing appropriate contact time as indicated for disinfecting solutions.  Notice: This dictation was prepared with Dragon dictation along with smaller phrase technology. Any transcriptional errors that result from this process are unintentional and may not be corrected upon review.  Patient Instructions / Medication Changes & Studies & Tests Ordered   Patient Instructions  Medication Instructions:  Stop taking Aspirin  Increase Carvedilol 12.5 mg twice a day  ( may take 2 tablets of 6.25 mg twice a day until bottle is empty)  *If you need a refill on your cardiac medications before your next  appointment, please call your pharmacy*   Lab Work:  Labs in May or June Lipid cmp Cbc hgba1c  Oct 2022 The same labs - fasting   If you have labs (blood work) drawn today and your tests are completely normal, you will receive your results only by: Marland Kitchen MyChart Message (if you have MyChart) OR . A paper copy in the mail If you have any lab test that is abnormal or we need to change your treatment, we will call you to review the results.   Testing/Procedures: Not needed   Follow-Up: At [facility],  you and your health needs are our priority.  As part of our continuing mission to provide you with exceptional heart care, we have created designated Provider Care Teams.  These Care Teams include your primary Cardiologist (physician) and Advanced Practice Providers (APPs -  Physician Assistants and Nurse Practitioners) who all work together to provide you with the care you need, when you need it.  We recommend signing up for the patient portal called "MyChart".  Sign up information is provided on this After Visit Summary.  MyChart is used to connect with patients for Virtual Visits (Telemedicine).  Patients are able to view lab/test results, encounter notes, upcoming appointments, etc.  Non-urgent messages can be sent to your provider as well.   To learn more about what you can do with MyChart, go to [email or web address].    Your next appointment:   6 month(s)  The format for your next appointment:   In Person  Provider:   You will see one of the following Advanced Practice Providers on your designated Care Team:    Theodore DemarkRhonda Barrett, PA-C  Joni ReiningKathryn Lawrence, DNP, ANP  Then, Bryan Lemmaavid Jahayra Mazo, MD will plan to see you again in 12 month(s).     Studies Ordered:   Orders Placed This Encounter  Procedures  . Lipid panel  . Comprehensive metabolic panel  . Hemoglobin A1c  . CBC  . Lipid panel  . Comprehensive metabolic panel  . CBC  . Hemoglobin A1c  . EKG 12-Lead      Bryan Lemmaavid Klohe Lovering, M.D., M.S. Interventional Cardiologist   Pager # [phone] Phone # [phone] [address] [place], [place] [address]   Thank you for choosing [facility] at [facility]!!

## 2021-01-25 ENCOUNTER — Encounter: Payer: Self-pay | Admitting: Cardiology

## 2021-01-25 NOTE — Assessment & Plan Note (Signed)
BP still little bit elevated and heart rate is 87 bpm.  Plan:  Further titrate carvedilol to 12.5 mg twice daily.  Continue losartan 25 mg daily.

## 2021-01-25 NOTE — Assessment & Plan Note (Signed)
He now is fully quit smoking, but as a result has gained quite a bit of weight.  He has no desire now to smoke, to the point where he actually does not like the smell of smoke, where previously he had craves cigarettes.

## 2021-01-25 NOTE — Assessment & Plan Note (Signed)
Still gaining weight.  A little bit far along to blame it fully on smoking cessation.  At this time, he needs to take more responsibility with his weight.  We talked about adjusting his diet.  Perhaps we could refer him to [facility].  He may also benefit of GLP-1 agonist.

## 2021-01-25 NOTE — Assessment & Plan Note (Signed)
DES PCI to 100% and occluded distal RCA followed by staged PCI to the 90% LAD prior to discharge.  There was some distal embolization occluding the apical PDA at the time of his initial infarct.  No significant wall motion normality on follow-up echo would indicate that this did recanalized.  Plan:  DC aspirin.  Increase carvedilol to 12.5 mg twice daily and continue losartan 25 mg daily.  Continue 4 mg daily atorvastatin.  Will continue maintenance Plavix/clopidogrel 75 mg daily based on two-vessel CAD.  Okay to hold Plavix 5 days preop for routine surgeries in 7 days preop for neuro/spinal procedures.

## 2021-01-25 NOTE — Assessment & Plan Note (Addendum)
Tolerating atorvastatin 80 mg daily.  Labs as of June last year looked outstanding with LDL of 24.  He had an A1c of 5.6.  He has gained quite a bit of weight since that time and I am wondering/worrying if his labs do not look as good.  He is taking metformin, and we can consider SGLT2 inhibitor or GLP-1 agonist with his obesity if he were able to qualify for financial assistance.  Plan: For now continue current medications including metformin and 40 mg atorvastatin.  Follow-up labs in June, and then again in October: FLP, CMP, CBC and A1c.  We discussed the importance of trying to work on changing his diet and increasing exercise.  Smoking cessation was what set him off.

## 2021-01-25 NOTE — Assessment & Plan Note (Signed)
Just about 18 months out from his inferior STEMI.  Doing well with no recurrent issues.  No active angina or heart failure symptoms.  He is happy to acknowledge that he has quit smoking.  Still needs to work on diet and exercise, but felt like smoking cessation was more important at that time.  Is now trying to focus on diet and exercise.  Tolerating current medications without any issues.

## 2021-02-21 LAB — LIPID PANEL
Chol/HDL Ratio: 4.2 ratio (ref 0.0–5.0)
Cholesterol, Total: 134 mg/dL (ref 100–199)
HDL: 32 mg/dL — ABNORMAL LOW (ref >39)
LDL Chol Calc (NIH): 37 mg/dL (ref 0–99)
Triglycerides: 457 mg/dL — ABNORMAL HIGH (ref 0–149)
VLDL Cholesterol Cal: 65 mg/dL — ABNORMAL HIGH (ref 5–40)

## 2021-02-21 LAB — COMPREHENSIVE METABOLIC PANEL
ALT: 48 IU/L — ABNORMAL HIGH (ref 0–44)
AST: 25 IU/L (ref 0–40)
Albumin/Globulin Ratio: 1.9 (ref 1.2–2.2)
Albumin: 4.5 g/dL (ref 4.0–5.0)
Alkaline Phosphatase: 64 IU/L (ref 44–121)
BUN/Creatinine Ratio: 27 — ABNORMAL HIGH (ref 9–20)
BUN: 18 mg/dL (ref 6–24)
Bilirubin Total: 0.3 mg/dL (ref 0.0–1.2)
CO2: 24 mmol/L (ref 20–29)
Calcium: 9.7 mg/dL (ref 8.7–10.2)
Chloride: 101 mmol/L (ref 96–106)
Creatinine, Ser: 0.66 mg/dL — ABNORMAL LOW (ref 0.76–1.27)
Globulin, Total: 2.4 g/dL (ref 1.5–4.5)
Glucose: 145 mg/dL — ABNORMAL HIGH (ref 65–99)
Potassium: 4.8 mmol/L (ref 3.5–5.2)
Sodium: 139 mmol/L (ref 134–144)
Total Protein: 6.9 g/dL (ref 6.0–8.5)
eGFR: 120 mL/min/{1.73_m2} (ref >59)

## 2021-02-21 LAB — CBC
Hematocrit: 43.4 % (ref 37.5–51.0)
Hemoglobin: 14.5 g/dL (ref 13.0–17.7)
MCH: 30 pg (ref 26.6–33.0)
MCHC: 33.4 g/dL (ref 31.5–35.7)
MCV: 90 fL (ref 79–97)
Platelets: 231 10*3/uL (ref 150–450)
RBC: 4.84 x10E6/uL (ref 4.14–5.80)
RDW: 13.1 % (ref 11.6–15.4)
WBC: 7.1 10*3/uL (ref 3.4–10.8)

## 2021-02-21 LAB — HEMOGLOBIN A1C
Est. average glucose Bld gHb Est-mCnc: 148 mg/dL
Hgb A1c MFr Bld: 6.8 % — ABNORMAL HIGH (ref 4.8–5.6)

## 2021-03-15 ENCOUNTER — Encounter: Payer: Self-pay | Admitting: *Deleted

## 2021-03-15 ENCOUNTER — Telehealth: Payer: Self-pay | Admitting: *Deleted

## 2021-03-15 MED ORDER — ICOSAPENT ETHYL 1 G PO CAPS: 2.0000 g | ORAL_CAPSULE | Freq: Two times a day (BID) | ORAL | 3 refills | Status: DC

## 2021-03-15 NOTE — Telephone Encounter (Signed)
Pr Dr Herbie Baltimore : Cholesterol panel is definitely worse than it was last year.  Total cholesterol is at 134.  Thankfully the LDL level is 37 which is very well controlled.  The HDL level is higher as well which is good.  Unfortunately the triglycerides arestill high at 450.   I think we need to switch to the generic fish oil for Vascepa    Rx:  Vascepa 1g -> 2 tablets 2 times a day.  Dispense 360 tabs, 3 refills.

## 2021-03-15 NOTE — Telephone Encounter (Signed)
-----   Message from Marykay Lex, MD sent at 03/15/2021 10:00 AM EDT ----- Chemistry panel has normal kidney function.  Normal liver function.  Blood sugar levels are still high and that is one of the reasons why the triglycerides are high.  Need to adjust diet reducing caloric intake reducing fat intake along with carb intake.  Bryan Lemma, MD

## 2021-03-15 NOTE — Progress Notes (Signed)
Cholesterol panel is definitely worse than it was last year.  Total cholesterol is at 134.  Thankfully the LDL level is 37 which is very well controlled.  The HDL level is higher as well which is good.  Unfortunately the triglycerides are still high at 450.  I think we need to switch to the generic fish oil for Vascepa   Rx:  Vascepa 1g -> 2 tablets 2 times a day.  Dispense 360 tabs, 3 refills.  Bryan Lemma, MD

## 2021-03-15 NOTE — Telephone Encounter (Signed)
Called -  left detailed message on patient voice mail per dpr .   New prescription was e-sent to pharmacy   Discontinue fish oil per order.   Started vascepa  Patient may call back if he has any question

## 2021-03-18 IMAGING — DX DG CHEST 1V PORT
1 series · 1 of 1 positions shown · non-contrast
Comparison: 08/08/2010.

CLINICAL DATA: Chest pain.

EXAM:
PORTABLE CHEST 1 VIEW

[chest ap]
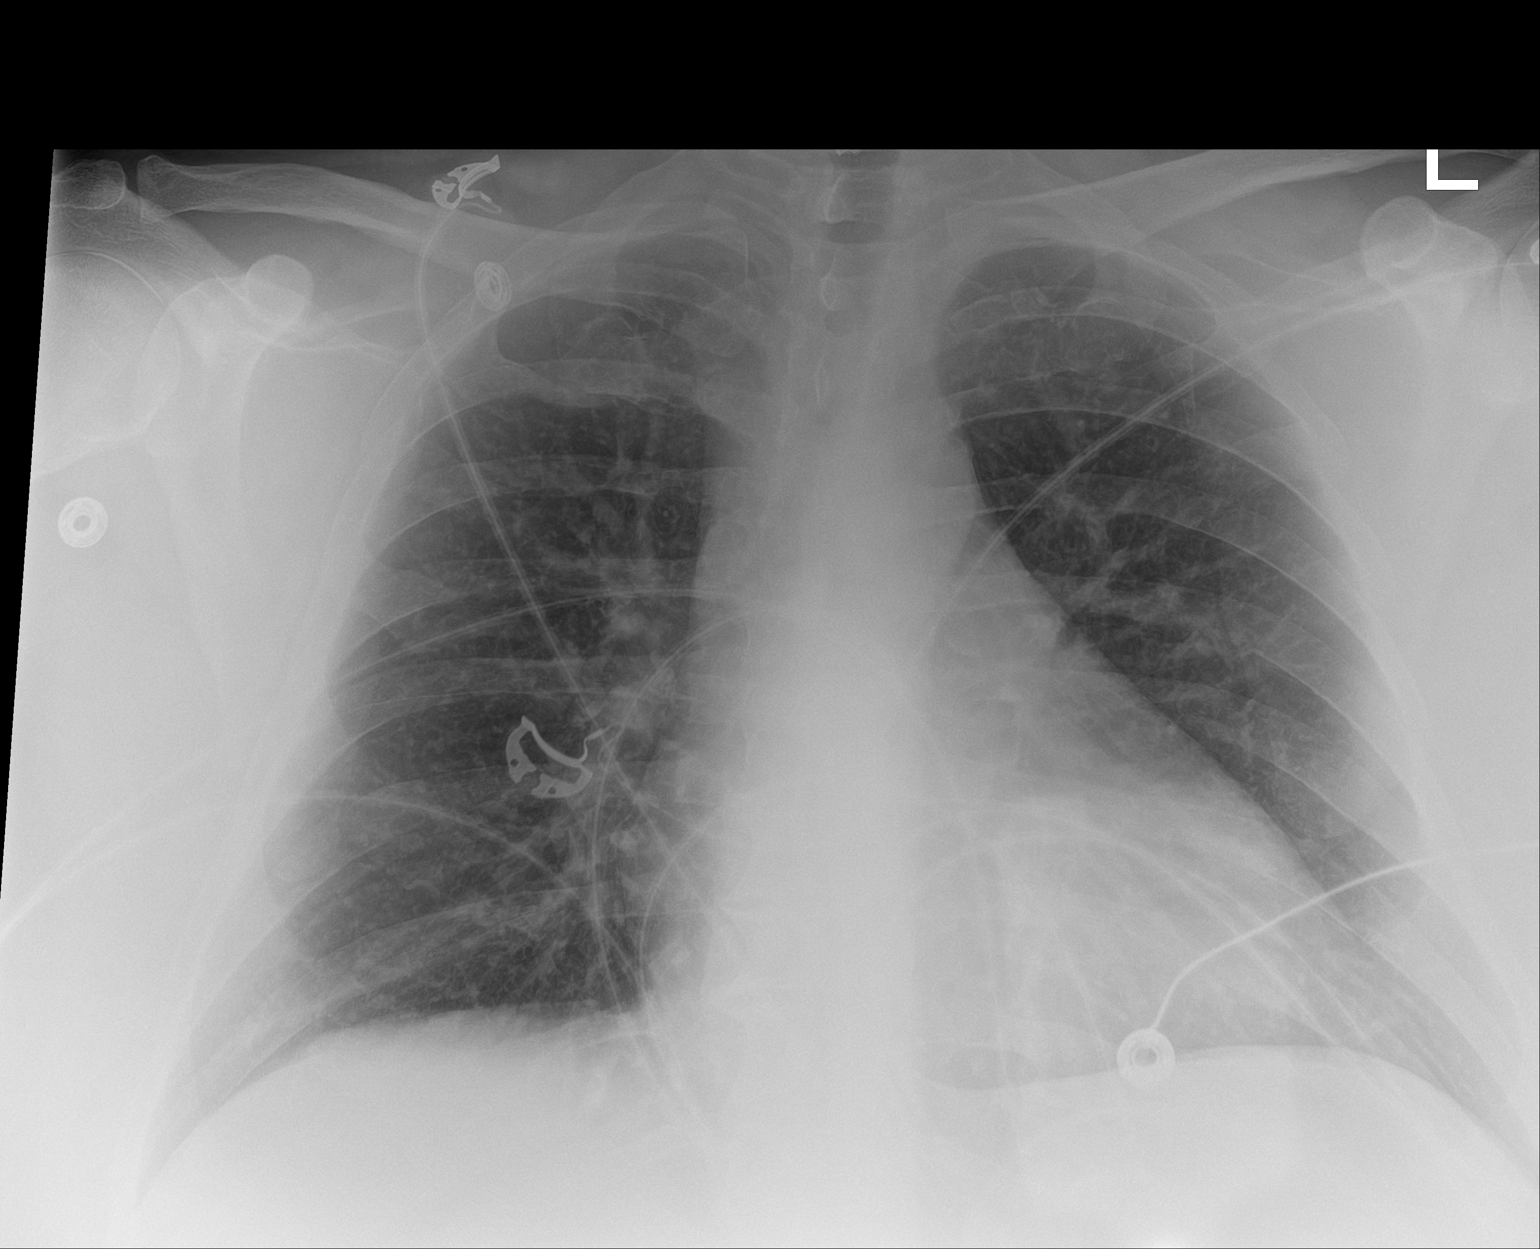

[1 of 1 positions shown; findings below may reference images not displayed]

FINDINGS: Mediastinum hilar structures normal. Cardiomegaly with normal
pulmonary vascularity. No focal infiltrate. No pleural effusion
pneumothorax. Degenerative change thoracic spine.
IMPRESSION: 1.  Cardiomegaly.  No pulmonary venous congestion.

2.  No acute pulmonary disease.

## 2021-04-03 NOTE — Telephone Encounter (Signed)
  Not sure what is being asked.  The prescription was to take 1-1/2 tablet twice daily.  The original prescription was for 3 months supply with 3 refills which should be a year. Bryan Lemma, MD

## 2021-04-17 ENCOUNTER — Telehealth: Payer: Self-pay | Admitting: *Deleted

## 2021-04-17 NOTE — Telephone Encounter (Signed)
Patient is returning call.  °

## 2021-04-17 NOTE — Telephone Encounter (Signed)
RN left message for patient to call back  RN - has question about how patient is taking carvedilol    Last office (01/03/21) patient was to increase to 12. 5 mg twice a day    RN would like to confirm the direction with patient.

## 2021-07-01 ENCOUNTER — Other Ambulatory Visit: Payer: Self-pay | Admitting: Cardiology

## 2021-07-15 ENCOUNTER — Emergency Department (HOSPITAL_COMMUNITY)
Admission: EM | Admit: 2021-07-15 | Discharge: 2021-07-15 | Disposition: A | Discharge status: Home / Self Care | Payer: Self-pay | Attending: Emergency Medicine | Admitting: Emergency Medicine

## 2021-07-15 ENCOUNTER — Other Ambulatory Visit: Payer: Self-pay

## 2021-07-15 ENCOUNTER — Encounter (HOSPITAL_COMMUNITY): Payer: Self-pay

## 2021-07-15 DIAGNOSIS — E119 Type 2 diabetes mellitus without complications: Secondary | ICD-10-CM | POA: Insufficient documentation

## 2021-07-15 DIAGNOSIS — Z79899 Other long term (current) drug therapy: Secondary | ICD-10-CM | POA: Insufficient documentation

## 2021-07-15 DIAGNOSIS — I10 Essential (primary) hypertension: Secondary | ICD-10-CM | POA: Insufficient documentation

## 2021-07-15 DIAGNOSIS — I2510 Atherosclerotic heart disease of native coronary artery without angina pectoris: Secondary | ICD-10-CM | POA: Insufficient documentation

## 2021-07-15 DIAGNOSIS — Z7984 Long term (current) use of oral hypoglycemic drugs: Secondary | ICD-10-CM | POA: Insufficient documentation

## 2021-07-15 DIAGNOSIS — H00015 Hordeolum externum left lower eyelid: Secondary | ICD-10-CM | POA: Insufficient documentation

## 2021-07-15 DIAGNOSIS — Z87891 Personal history of nicotine dependence: Secondary | ICD-10-CM | POA: Insufficient documentation

## 2021-07-15 NOTE — ED Triage Notes (Signed)
Patient presents with left eye pain and swelling that began this morning.

## 2021-07-15 NOTE — Discharge Instructions (Addendum)
You have a stye of your left lower eyelid.  These generally last about 1 to 2 weeks.  I recommend you apply warm compresses to the left eye 4 times a day.  If you develop worsening in your redness surrounding your eye, visual change, significant drainage from the area or fever please return to the emergency room.

## 2021-07-15 NOTE — ED Provider Notes (Signed)
[facility]-EMERGENCY DEPT Provider Note   CSN: [id number] Arrival date & time: 07/15/21  1002     History Chief Complaint  Patient presents with   Eye Pain    PANFILO KETCHUM is a 45 y.o. male.  45 year old male presents today for evaluation of left eye pain and swelling of 3-day duration.  Patient has been using over-the-counter stye eyedrops without improvement.  Patient has not tried any warm compresses or other management at home.  Patient denies fever, chills, visual change, or headache.  He denies injury.  The history is provided by the patient. No language interpreter was used.      Past Medical History:  Diagnosis Date   Acute ST elevation myocardial infarction (STEMI) of inferior wall (HCC) 05/26/2019   dRCA occluded--DES PCI (distal PDA occluded with distalization.  Codominant circumflex patent.  With staged PCI of 90% mLAD; normal EF on echo   Coronary artery disease involving native coronary artery of native heart with unstable angina pectoris (HCC) 05/26/2019   a. 05/26/2019 PCI: 100% dRCA - DES PCI (Synergy DES 2.5 x 28 - 2.65 mm); pLAD focal 90%.  Co-dominant LCx, normal; b. 05/27/2019 Staged PCI: mLAD 90% (2.75x12 Synergy DES - 2.38mm).   Hyperlipidemia associated with type 2 diabetes mellitus (HCC)    Morbid obesity with BMI of 45.0-49.9, adult (HCC)    Tobacco abuse - 2PPD 05/26/2019   Type II diabetes mellitus with complication [facility])    Not currently taking medications    Patient Active Problem List   Diagnosis Date Noted   Hyperlipidemia associated with type 2 diabetes mellitus (HCC) 09/16/2019   Essential hypertension 09/16/2019   New onset type 2 diabetes mellitus (HCC) 06/14/2019   Morbid obesity (HCC) 06/14/2019   ST elevation myocardial infarction (STEMI) of inferior wall (HCC) 05/26/2019   CAD -S/P PCI 05/26/2019   Former heavy tobacco smoker 05/26/2019    Past Surgical History:  Procedure Laterality Date   CORONARY STENT  INTERVENTION N/A 05/27/2019   Procedure: CORONARY STENT INTERVENTION;  Surgeon: Marykay Lex, MD;  Location: [facility] INVASIVE CV LAB;  Service: Cardiovascular:: Staged PCI of mid LAD-Synergy DES 2.7 mm at 12 mm (2.9 mm).    CORONARY STENT INTERVENTION N/A 05/26/2019   Procedure: CORONARY STENT INTERVENTION;  Surgeon: Marykay Lex, MD;  Location: [facility] INVASIVE CV LAB;  Service: Cardiovascular:: distal RCA 100% (DES PCI Synergy 2.5 mm x 20 mm - 2.6 mm).   CORONARY/GRAFT ACUTE MI REVASCULARIZATION N/A 05/26/2019   Procedure: CORONARY/GRAFT ACUTE MI REVASCULARIZATION;  Surgeon: Marykay Lex, MD;  Location: [facility] INVASIVE CV LAB;  Service: Cardiovascular:: PCI of dRCA   LEFT HEART CATH AND CORONARY ANGIOGRAPHY N/A 05/26/2019   Procedure: LEFT HEART CATH AND CORONARY ANGIOGRAPHY;  Surgeon: Marykay Lex, MD;  Location: [facility] INVASIVE CV LAB;  Service: Cardiovascular:: dRCA 100% (DES PCI) with dRPDA 100% from distal embolization, mLAD 90% (Staged DES PCI).    TRANSTHORACIC ECHOCARDIOGRAM  05/27/2019   EF 60 to 65%.  Unable to assess wall motion because of poor images.  GR 1 DD.  Both atria are normal size.  Normal valves.   WRIST SURGERY         Family History  Problem Relation Age of Onset   Diabetes Mother    Heart attack Mother    CAD Mother    Diabetes Father    Cancer Father    Diabetes Brother     Social History   Tobacco Use  Smoking status: Former    Packs/day: 1.50    Years: 10.00    Pack years: 15.00    Types: Cigarettes    Quit date: 05/27/2019    Years since quitting: 2.1   Smokeless tobacco: Never  Vaping Use   Vaping Use: Never used  Substance Use Topics   Alcohol use: No   Drug use: No    Home Medications Prior to Admission medications   Medication Sig Start Date End Date Taking? Authorizing Provider  atorvastatin (LIPITOR) 40 MG tablet Take 1 tablet (40 mg total) by mouth daily at 6 PM. 03/01/20   Marykay Lex, MD  atorvastatin (LIPITOR) 80 MG tablet TAKE 1  TABLET BY MOUTH DAILY AT [facility] 07/01/21   Marykay Lex, MD  carvedilol (COREG) 12.5 MG tablet Take 1 tablet (12.5 mg total) by mouth 2 (two) times daily. 01/03/21 04/03/21  Marykay Lex, MD  clopidogrel (PLAVIX) 75 MG tablet TAKE ONE TABLET BY MOUTH DAILY 10/01/20   Marykay Lex, MD  icosapent Ethyl (VASCEPA) 1 g capsule Take 2 capsules (2 g total) by mouth 2 (two) times daily. 03/15/21   Marykay Lex, MD  losartan (COZAAR) 25 MG tablet TAKE ONE TABLET BY MOUTH DAILY 10/01/20   Marykay Lex, MD  metFORMIN (GLUCOPHAGE) 500 MG tablet Take 1 tablet (500 mg total) by mouth 2 (two) times daily with a meal. 01/03/21   Marykay Lex, MD  nitroGLYCERIN (NITROSTAT) 0.4 MG SL tablet Place 1 tablet (0.4 mg total) under the tongue every 5 (five) minutes as needed for chest pain (CP or SOB). 09/16/19   Marykay Lex, MD    Allergies    Patient has no known allergies.  Review of Systems   Review of Systems  Constitutional:  Negative for chills and fever.  Eyes:  Positive for pain. Negative for photophobia, discharge, redness and visual disturbance.   Physical Exam Updated Vital Signs BP (!) 169/102   Pulse 78   Temp 98.5 F (36.9 C) (Oral)   Resp 18   SpO2 96%   Physical Exam Vitals and nursing note reviewed.  Constitutional:      General: He is not in acute distress.    Appearance: Normal appearance. He is not ill-appearing.  HENT:     Head: Normocephalic and atraumatic.     Nose: Nose normal.  Eyes:     General: Lids are everted, no foreign bodies appreciated.        Left eye: Hordeolum present.    Extraocular Movements:     Right eye: Normal extraocular motion and no nystagmus.     Left eye: Normal extraocular motion and no nystagmus.     Conjunctiva/sclera: Conjunctivae normal.     Right eye: Right conjunctiva is not injected.     Left eye: Left conjunctiva is not injected.     Comments: External hordeolum present over left lower eyelid.  Without drainage or surrounding  erythema.  Without concern for preseptal cellulitis.  Without associated conjunctivitis.   Pulmonary:     Effort: Pulmonary effort is normal. No respiratory distress.  Musculoskeletal:        General: No deformity.  Skin:    Findings: No rash.  Neurological:     Mental Status: He is alert.    ED Results / Procedures / Treatments   Labs (all labs ordered are listed, but only abnormal results are displayed) Labs Reviewed - No data to display  EKG None  Radiology  No results found.  Procedures Procedures   Medications Ordered in ED Medications - No data to display  ED Course  I have reviewed the triage vital signs and the nursing notes.  Pertinent labs & imaging results that were available during my care of the patient were reviewed by me and considered in my medical decision making (see chart for details).    MDM Rules/Calculators/A&P                           45 year old male with left lower lid external hordeolum without associated conjunctivitis surrounding erythema concerning for preseptal cellulitis.  Patient without vision change or pain with extraocular eye movements.  Discussed symptomatic management with warm compresses 4 times a day.  Discussed expected course.  Return precautions discussed.  Patient voices understanding and is in agreement with plan.  Final Clinical Impression(s) / ED Diagnoses Final diagnoses:  None    Rx / DC Orders ED Discharge Orders     None        Marita Kansas, PA-C 07/15/21 1550    Rolan Bucco, MD 07/15/21 1946

## 2021-07-15 NOTE — ED Notes (Signed)
Patient not found in his room to complete the discharge process.

## 2021-07-25 LAB — CBC
Hematocrit: 40.9 % (ref 37.5–51.0)
Hemoglobin: 14.3 g/dL (ref 13.0–17.7)
MCH: 30.5 pg (ref 26.6–33.0)
MCHC: 35 g/dL (ref 31.5–35.7)
MCV: 87 fL (ref 79–97)
Platelets: 222 10*3/uL (ref 150–450)
RBC: 4.69 x10E6/uL (ref 4.14–5.80)
RDW: 12.6 % (ref 11.6–15.4)
WBC: 6.1 10*3/uL (ref 3.4–10.8)

## 2021-07-25 LAB — COMPREHENSIVE METABOLIC PANEL
ALT: 45 IU/L — ABNORMAL HIGH (ref 0–44)
AST: 29 IU/L (ref 0–40)
Albumin/Globulin Ratio: 1.9 (ref 1.2–2.2)
Albumin: 4.6 g/dL (ref 4.0–5.0)
Alkaline Phosphatase: 65 IU/L (ref 44–121)
BUN/Creatinine Ratio: 19 (ref 9–20)
BUN: 12 mg/dL (ref 6–24)
Bilirubin Total: 0.4 mg/dL (ref 0.0–1.2)
CO2: 26 mmol/L (ref 20–29)
Calcium: 9.5 mg/dL (ref 8.7–10.2)
Chloride: 100 mmol/L (ref 96–106)
Creatinine, Ser: 0.62 mg/dL — ABNORMAL LOW (ref 0.76–1.27)
Globulin, Total: 2.4 g/dL (ref 1.5–4.5)
Glucose: 149 mg/dL — ABNORMAL HIGH (ref 70–99)
Potassium: 4.4 mmol/L (ref 3.5–5.2)
Sodium: 139 mmol/L (ref 134–144)
Total Protein: 7 g/dL (ref 6.0–8.5)
eGFR: 122 mL/min/{1.73_m2} (ref >59)

## 2021-07-25 LAB — HEMOGLOBIN A1C
Est. average glucose Bld gHb Est-mCnc: 166 mg/dL
Hgb A1c MFr Bld: 7.4 % — ABNORMAL HIGH (ref 4.8–5.6)

## 2021-07-25 LAB — LIPID PANEL
Chol/HDL Ratio: 4.1 ratio (ref 0.0–5.0)
Cholesterol, Total: 141 mg/dL (ref 100–199)
HDL: 34 mg/dL — ABNORMAL LOW (ref >39)
LDL Chol Calc (NIH): 44 mg/dL (ref 0–99)
Triglycerides: 427 mg/dL — ABNORMAL HIGH (ref 0–149)
VLDL Cholesterol Cal: 63 mg/dL — ABNORMAL HIGH (ref 5–40)

## 2021-07-26 ENCOUNTER — Other Ambulatory Visit: Payer: Self-pay

## 2021-07-26 ENCOUNTER — Encounter: Payer: Self-pay | Admitting: Physician Assistant

## 2021-07-26 ENCOUNTER — Ambulatory Visit (INDEPENDENT_AMBULATORY_CARE_PROVIDER_SITE_OTHER): Payer: No Typology Code available for payment source | Admitting: Physician Assistant

## 2021-07-26 VITALS — BP 148/97 | HR 77 | Temp 98.1°F | Ht 74.0 in | Wt 386.4 lb

## 2021-07-26 DIAGNOSIS — E1165 Type 2 diabetes mellitus with hyperglycemia: Secondary | ICD-10-CM | POA: Diagnosis not present

## 2021-07-26 DIAGNOSIS — I2119 ST elevation (STEMI) myocardial infarction involving other coronary artery of inferior wall: Secondary | ICD-10-CM | POA: Diagnosis not present

## 2021-07-26 DIAGNOSIS — I2510 Atherosclerotic heart disease of native coronary artery without angina pectoris: Secondary | ICD-10-CM

## 2021-07-26 DIAGNOSIS — E1169 Type 2 diabetes mellitus with other specified complication: Secondary | ICD-10-CM

## 2021-07-26 DIAGNOSIS — I10 Essential (primary) hypertension: Secondary | ICD-10-CM

## 2021-07-26 DIAGNOSIS — E785 Hyperlipidemia, unspecified: Secondary | ICD-10-CM

## 2021-07-26 DIAGNOSIS — Z87891 Personal history of nicotine dependence: Secondary | ICD-10-CM | POA: Diagnosis not present

## 2021-07-26 DIAGNOSIS — Z9861 Coronary angioplasty status: Secondary | ICD-10-CM

## 2021-07-26 MED ORDER — METFORMIN HCL 500 MG PO TABS: 500.0000 mg | ORAL_TABLET | Freq: Two times a day (BID) | ORAL | 1 refills | Status: DC

## 2021-07-26 NOTE — Patient Instructions (Addendum)
*  Please check with family members on type of cancer in family.   *See handout provided about high triglycerides  *Start slowly working towards goals. I would encourage walking 15 minutes daily. *Think of one food you can cut back on - ie. Chips or potatoes or candy etc  Start on Mounjaro: Initial: 2.5 mg injection once weekly for 4 weeks  Call sooner if any concerns

## 2021-07-26 NOTE — Progress Notes (Signed)
Subjective:    Patient ID: David Bender, male    DOB: [birth year], 45 y.o.   MRN: [id number]  Chief Complaint  Patient presents with   Establish Care    HPI 45 y.o. patient presents today for new patient establishment with me.  Patient was previously established with primary care a long time ago, has been w/o insurance. 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 Lives alone. Born in [place]. Mom lives here. Two brothers live here as well and relationship is so-so.   M-F works 8 am - 5 pm 5 pm -8 am in the morning "on call" All weekend "on call"  Current Care Team: Dr. Herbie Baltimore - cardiologist   Acute Concerns: Needs refill on his Metformin. Needs help managing his diabetes.   Chronic Concerns: See PMH listed below, as well as A/P for details on issues we specifically discussed during today's visit.      Past Medical History:  Diagnosis Date   Acute ST elevation myocardial infarction (STEMI) of inferior wall (HCC) 05/26/2019   dRCA occluded--DES PCI (distal PDA occluded with distalization.  Codominant circumflex patent.  With staged PCI of 90% mLAD; normal EF on echo   Coronary artery disease involving native coronary artery of native heart with unstable angina pectoris (HCC) 05/26/2019   a. 05/26/2019 PCI: 100% dRCA - DES PCI (Synergy DES 2.5 x 28 - 2.65 mm); pLAD focal 90%.  Co-dominant LCx, normal; b. 05/27/2019 Staged PCI: mLAD 90% (2.75x12 Synergy DES - 2.38mm).   Hyperlipidemia associated with type 2 diabetes mellitus (HCC)    Morbid obesity with BMI of 45.0-49.9, adult (HCC)    Tobacco abuse - 2PPD 05/26/2019   Type II diabetes mellitus with complication [facility])    Not currently taking medications    Past Surgical History:  Procedure Laterality Date   CORONARY STENT INTERVENTION N/A 05/27/2019   Procedure: CORONARY STENT INTERVENTION;  Surgeon: Marykay Lex, MD;  Location: [facility] INVASIVE CV LAB;  Service: Cardiovascular:: Staged PCI of mid LAD-Synergy DES 2.7 mm at 12 mm  (2.9 mm).    CORONARY STENT INTERVENTION N/A 05/26/2019   Procedure: CORONARY STENT INTERVENTION;  Surgeon: Marykay Lex, MD;  Location: [facility] INVASIVE CV LAB;  Service: Cardiovascular:: distal RCA 100% (DES PCI Synergy 2.5 mm x 20 mm - 2.6 mm).   CORONARY/GRAFT ACUTE MI REVASCULARIZATION N/A 05/26/2019   Procedure: CORONARY/GRAFT ACUTE MI REVASCULARIZATION;  Surgeon: Marykay Lex, MD;  Location: [facility] INVASIVE CV LAB;  Service: Cardiovascular:: PCI of dRCA   LEFT HEART CATH AND CORONARY ANGIOGRAPHY N/A 05/26/2019   Procedure: LEFT HEART CATH AND CORONARY ANGIOGRAPHY;  Surgeon: Marykay Lex, MD;  Location: [facility] INVASIVE CV LAB;  Service: Cardiovascular:: dRCA 100% (DES PCI) with dRPDA 100% from distal embolization, mLAD 90% (Staged DES PCI).    TRANSTHORACIC ECHOCARDIOGRAM  05/27/2019   EF 60 to 65%.  Unable to assess wall motion because of poor images.  GR 1 DD.  Both atria are normal size.  Normal valves.   WRIST SURGERY      Family History  Problem Relation Age of Onset   Diabetes Mother    Heart attack Mother    CAD Mother    Diabetes Father    Cancer Father    Diabetes Brother     Social History   Tobacco Use   Smoking status: Former    Packs/day: 1.50    Years: 10.00    Pack years: 15.00    Types: Cigarettes  Quit date: 05/27/2019    Years since quitting: 2.1   Smokeless tobacco: Never  Vaping Use   Vaping Use: Never used  Substance Use Topics   Alcohol use: No   Drug use: No     No Known Allergies  Review of Systems NEGATIVE UNLESS OTHERWISE INDICATED IN HPI      Objective:     BP (!) 148/97   Pulse 77   Temp 98.1 F (36.7 C)   Ht 6\' 2"  (1.88 m)   Wt (!) 386 lb 6.1 oz (175.3 kg)   SpO2 98%   BMI 49.61 kg/m   Wt Readings from Last 3 Encounters:  07/26/21 (!) 386 lb 6.1 oz (175.3 kg)  01/03/21 (!) 386 lb 9.6 oz (175.4 kg)  06/14/20 (!) 357 lb (161.9 kg)    BP Readings from Last 3 Encounters:  07/26/21 (!) 148/97  07/15/21 (!) 169/102   01/03/21 (!) 142/98     Physical Exam Vitals and nursing note reviewed.  Constitutional:      General: He is not in acute distress.    Appearance: Normal appearance. He is obese. He is not toxic-appearing.  HENT:     Head: Normocephalic and atraumatic.     Right Ear: Tympanic membrane, ear canal and external ear normal.     Left Ear: Tympanic membrane, ear canal and external ear normal.     Nose: Nose normal.     Mouth/Throat:     Mouth: Mucous membranes are moist.     Pharynx: Oropharynx is clear.  Eyes:     Extraocular Movements: Extraocular movements intact.     Conjunctiva/sclera: Conjunctivae normal.     Pupils: Pupils are equal, round, and reactive to light.  Cardiovascular:     Rate and Rhythm: Normal rate and regular rhythm.     Pulses: Normal pulses.     Heart sounds: Normal heart sounds.  Pulmonary:     Effort: Pulmonary effort is normal.     Breath sounds: Normal breath sounds.  Abdominal:     General: Abdomen is flat. Bowel sounds are normal.     Palpations: Abdomen is soft.     Tenderness: There is no abdominal tenderness.  Musculoskeletal:        General: Normal range of motion.     Cervical back: Normal range of motion and neck supple.     Right lower leg: No edema.     Left lower leg: No edema.  Skin:    General: Skin is warm and dry.  Neurological:     General: No focal deficit present.     Mental Status: He is alert and oriented to person, place, and time.     Sensory: No sensory deficit.  Psychiatric:        Mood and Affect: Mood normal.        Behavior: Behavior normal.       Assessment & Plan:   Problem List Items Addressed This Visit       Cardiovascular and Mediastinum   ST elevation myocardial infarction (STEMI) of inferior wall (HCC) (Chronic)   CAD -S/P PCI (Chronic)   Essential hypertension (Chronic)     Endocrine   Hyperlipidemia associated with type 2 diabetes mellitus (HCC) (Chronic)   Relevant Medications   metFORMIN  (GLUCOPHAGE) 500 MG tablet     Other   Former heavy tobacco smoker (Chronic)   Morbid obesity (HCC) (Chronic)   Relevant Medications   metFORMIN (GLUCOPHAGE) 500 MG tablet  Other Visit Diagnoses     Type 2 diabetes mellitus with hyperglycemia, without long-term current use of insulin (HCC)    -  Primary   Relevant Medications   metFORMIN (GLUCOPHAGE) 500 MG tablet        Meds ordered this encounter  Medications   metFORMIN (GLUCOPHAGE) 500 MG tablet    Sig: Take 1 tablet (500 mg total) by mouth 2 (two) times daily with a meal.    Dispense:  180 tablet    Refill:  1    Plan: -New patient establishment with hx STEMI, CAD s/p PCI 05/26/2019, following with Dr. Herbie Baltimore -Severe BMI discussed with patient, major co-morbidities, risk for early morbidity and mortality - need to get serious about weight loss /dietary / exercise changes.  -Refilled Metformin for him today -Plan to also start on Mounjaro, sample provided in office to start once weekly the next four weeks. Possible GI SE discussed with pt.  -Plan to f/up in 4 weeks with me. Encouraged him to start walking daily and think of some of his trouble foods to cut back on.  Cillian Gwinner M Anie Juniel, PA-C

## 2021-07-28 ENCOUNTER — Other Ambulatory Visit: Payer: Self-pay | Admitting: Cardiology

## 2021-08-01 ENCOUNTER — Telehealth: Payer: Self-pay | Admitting: *Deleted

## 2021-08-01 MED ORDER — VASCEPA 1 G PO CAPS: 2.0000 g | ORAL_CAPSULE | Freq: Two times a day (BID) | ORAL | 3 refills | Status: DC

## 2021-08-01 NOTE — Telephone Encounter (Signed)
Called left detailed message of results on secure voicemail- contact primary about diabetes control   Message states will call in new medication for triglycerides levels 427.  Instructed patient if medication is pricey call office .  Direction given on how to take Vascepa 1 gm  2 tablets  twice a day

## 2021-08-01 NOTE — Telephone Encounter (Signed)
-----   Message from Marykay Lex, MD sent at 07/28/2021  1:18 PM EST ----- Chemistry panel shows normal kidney function.  Blood glucose levels are still high at 49 which as fasting levels are too high.  Normal liver function.  Cholesterol panel has improvement in HDL up to 34, and stable LDL of 44.  Total cholesterol is 141 which is stable from 5 months ago.  Unfortunately, triglyceride levels are still elevated which is partially related to diabetes.  Would like to start Vascepa 1 g - take 2 tab BID; Disp # 360 tabs; 3 refills Need to talk with PCP about DM control.    Bryan Lemma, MD

## 2021-08-27 ENCOUNTER — Encounter: Payer: Self-pay | Admitting: Physician Assistant

## 2021-08-27 ENCOUNTER — Other Ambulatory Visit: Payer: Self-pay

## 2021-08-27 ENCOUNTER — Ambulatory Visit (INDEPENDENT_AMBULATORY_CARE_PROVIDER_SITE_OTHER): Payer: No Typology Code available for payment source | Admitting: Physician Assistant

## 2021-08-27 VITALS — BP 134/88 | HR 74 | Temp 97.2°F | Ht 74.0 in | Wt 360.0 lb

## 2021-08-27 DIAGNOSIS — E1165 Type 2 diabetes mellitus with hyperglycemia: Secondary | ICD-10-CM | POA: Diagnosis not present

## 2021-08-27 MED ORDER — TIRZEPATIDE 5 MG/0.5ML ~~LOC~~ SOAJ: 5.0000 mg | SUBCUTANEOUS | 0 refills | Status: AC

## 2021-08-27 NOTE — Patient Instructions (Addendum)
Fantastic work!!! So proud of you! Keep going with your goals of a healthier lifestyle. It's working!  New Rx for Mounjaro 5 mg/week for the next 4 weeks. Follow up with me when your next refill is due.  Call if any concerns.

## 2021-08-27 NOTE — Progress Notes (Signed)
Subjective:    Patient ID: David Bender, male    DOB: [birth year], 46 y.o.   MRN: [id number]  Chief Complaint  Patient presents with   Follow-up    HPI Patient is in today for recheck. He has been taking Mounjaro once weekly 2.5 mg for the last 4 weeks to help with his diabetes and weight loss. He has also been working on major lifestyle changes since his last visit with me.   He has quit eating out. He has been eating rotisserie chicken and vegetables. Some red meats. Cooking at home entirely now. Tracking calories and taking in about 1800 kcal per day. Protein shakes in the mornings. Quit all sodas and Redbull drinks. He cut back on all pastas as well.   He picked up a treadmill from his mom's house yesterday and plans to start on this this week.   Overall feeling very well about his progress so far and plans to keep this process going. No other health concerns today.   Past Medical History:  Diagnosis Date   Acute ST elevation myocardial infarction (STEMI) of inferior wall (HCC) 05/26/2019   dRCA occluded--DES PCI (distal PDA occluded with distalization.  Codominant circumflex patent.  With staged PCI of 90% mLAD; normal EF on echo   Coronary artery disease involving native coronary artery of native heart with unstable angina pectoris (HCC) 05/26/2019   a. 05/26/2019 PCI: 100% dRCA - DES PCI (Synergy DES 2.5 x 28 - 2.65 mm); pLAD focal 90%.  Co-dominant LCx, normal; b. 05/27/2019 Staged PCI: mLAD 90% (2.75x12 Synergy DES - 2.24mm).   Hyperlipidemia associated with type 2 diabetes mellitus (HCC)    Morbid obesity with BMI of 45.0-49.9, adult (HCC)    Tobacco abuse - 2PPD 05/26/2019   Type II diabetes mellitus with complication [facility])    Not currently taking medications    Past Surgical History:  Procedure Laterality Date   CORONARY STENT INTERVENTION N/A 05/27/2019   Procedure: CORONARY STENT INTERVENTION;  Surgeon: Marykay Lex, MD;  Location: [facility] INVASIVE CV LAB;  Service:  Cardiovascular:: Staged PCI of mid LAD-Synergy DES 2.7 mm at 12 mm (2.9 mm).    CORONARY STENT INTERVENTION N/A 05/26/2019   Procedure: CORONARY STENT INTERVENTION;  Surgeon: Marykay Lex, MD;  Location: [facility] INVASIVE CV LAB;  Service: Cardiovascular:: distal RCA 100% (DES PCI Synergy 2.5 mm x 20 mm - 2.6 mm).   CORONARY/GRAFT ACUTE MI REVASCULARIZATION N/A 05/26/2019   Procedure: CORONARY/GRAFT ACUTE MI REVASCULARIZATION;  Surgeon: Marykay Lex, MD;  Location: [facility] INVASIVE CV LAB;  Service: Cardiovascular:: PCI of dRCA   LEFT HEART CATH AND CORONARY ANGIOGRAPHY N/A 05/26/2019   Procedure: LEFT HEART CATH AND CORONARY ANGIOGRAPHY;  Surgeon: Marykay Lex, MD;  Location: [facility] INVASIVE CV LAB;  Service: Cardiovascular:: dRCA 100% (DES PCI) with dRPDA 100% from distal embolization, mLAD 90% (Staged DES PCI).    TRANSTHORACIC ECHOCARDIOGRAM  05/27/2019   EF 60 to 65%.  Unable to assess wall motion because of poor images.  GR 1 DD.  Both atria are normal size.  Normal valves.   WRIST SURGERY      Family History  Problem Relation Age of Onset   Diabetes Mother    Heart attack Mother    CAD Mother    Diabetes Father    Cancer Father    Diabetes Brother     Social History   Tobacco Use   Smoking status: Former    Packs/day: 1.50  Years: 10.00    Pack years: 15.00    Types: Cigarettes    Quit date: 05/27/2019    Years since quitting: 2.2   Smokeless tobacco: Never  Vaping Use   Vaping Use: Never used  Substance Use Topics   Alcohol use: No   Drug use: No     No Known Allergies  Review of Systems NEGATIVE UNLESS OTHERWISE INDICATED IN HPI      Objective:     BP 134/88    Pulse 74    Temp (!) 97.2 F (36.2 C)    Ht 6\' 2"  (1.88 m)    Wt (!) 360 lb (163.3 kg)    SpO2 97%    BMI 46.22 kg/m   Wt Readings from Last 3 Encounters:  08/27/21 (!) 360 lb (163.3 kg)  07/26/21 (!) 386 lb 6.1 oz (175.3 kg)  01/03/21 (!) 386 lb 9.6 oz (175.4 kg)    BP Readings from Last 3  Encounters:  08/27/21 134/88  07/26/21 (!) 148/97  07/15/21 (!) 169/102     Physical Exam Vitals and nursing note reviewed.  Constitutional:      General: He is not in acute distress.    Appearance: Normal appearance. He is obese. He is not toxic-appearing.  HENT:     Head: Normocephalic and atraumatic.     Right Ear: External ear normal.     Left Ear: External ear normal.     Nose: Nose normal.     Mouth/Throat:     Mouth: Mucous membranes are moist.     Pharynx: Oropharynx is clear.  Eyes:     Extraocular Movements: Extraocular movements intact.     Conjunctiva/sclera: Conjunctivae normal.     Pupils: Pupils are equal, round, and reactive to light.  Cardiovascular:     Rate and Rhythm: Normal rate and regular rhythm.     Pulses: Normal pulses.     Heart sounds: Normal heart sounds.  Pulmonary:     Effort: Pulmonary effort is normal.     Breath sounds: Normal breath sounds.  Musculoskeletal:        General: Normal range of motion.     Cervical back: Normal range of motion and neck supple.     Right lower leg: No edema.     Left lower leg: No edema.  Skin:    General: Skin is warm and dry.  Neurological:     General: No focal deficit present.     Mental Status: He is alert and oriented to person, place, and time.     Sensory: No sensory deficit.  Psychiatric:        Mood and Affect: Mood normal.        Behavior: Behavior normal.       Assessment & Plan:   Problem List Items Addressed This Visit       Other   Morbid obesity (HCC) (Chronic)   Relevant Medications   tirzepatide (MOUNJARO) 5 MG/0.5ML Pen   Other Visit Diagnoses     Type 2 diabetes mellitus with hyperglycemia, without long-term current use of insulin (HCC)    -  Primary   Relevant Medications   tirzepatide (MOUNJARO) 5 MG/0.5ML Pen        Meds ordered this encounter  Medications   tirzepatide (MOUNJARO) 5 MG/0.5ML Pen    Sig: Inject 5 mg into the skin once a week for 28 days.     Dispense:  6 mL    Refill:  0  1. Type 2 diabetes mellitus with hyperglycemia, without long-term current use of insulin (HCC) 2. Morbid obesity (HCC) -Congratulated him on 26 lb weight loss in the last 4 weeks -Doing very well with Mounjaro, will increase to 5 mg/week -Continue current regimen with Metformin 500 mg BID -He will continue lifestyle changes & keep working towards goals -F/up in 4 weeks with me   Crist Infante Rameen Quinney, PA-C

## 2021-08-28 ENCOUNTER — Telehealth: Payer: Self-pay

## 2021-08-28 NOTE — Telephone Encounter (Signed)
States [place] advised to give the office a call today to check on the status of the PA in process for mounjaro.

## 2021-08-28 NOTE — Telephone Encounter (Signed)
PA Placed waiting for determination patient aware

## 2021-09-06 ENCOUNTER — Telehealth: Payer: Self-pay | Admitting: Physician Assistant

## 2021-09-06 NOTE — Telephone Encounter (Signed)
Left detailed message PA denied

## 2021-09-06 NOTE — Telephone Encounter (Signed)
Patient wants to know if Arlyss Repress has been able to get pt Mounjaro. Please call pt at the number on file.     [facility].

## 2021-09-30 ENCOUNTER — Other Ambulatory Visit: Payer: Self-pay | Admitting: Cardiology

## 2021-10-08 ENCOUNTER — Ambulatory Visit (INDEPENDENT_AMBULATORY_CARE_PROVIDER_SITE_OTHER): Payer: No Typology Code available for payment source | Admitting: Physician Assistant

## 2021-10-08 ENCOUNTER — Other Ambulatory Visit: Payer: Self-pay

## 2021-10-08 VITALS — BP 125/86 | HR 75 | Temp 98.0°F | Ht 74.0 in | Wt 350.2 lb

## 2021-10-08 DIAGNOSIS — E1169 Type 2 diabetes mellitus with other specified complication: Secondary | ICD-10-CM | POA: Diagnosis not present

## 2021-10-08 DIAGNOSIS — E1165 Type 2 diabetes mellitus with hyperglycemia: Secondary | ICD-10-CM

## 2021-10-08 DIAGNOSIS — E785 Hyperlipidemia, unspecified: Secondary | ICD-10-CM

## 2021-10-08 MED ORDER — METFORMIN HCL 500 MG PO TABS: 1000.0000 mg | ORAL_TABLET | Freq: Two times a day (BID) | ORAL | 0 refills | Status: DC

## 2021-10-08 NOTE — Progress Notes (Signed)
Subjective:    Patient ID: David Bender, male    DOB: March 12, 1976, 46 y.o.   MRN: [id number]  Chief Complaint  Patient presents with   Diabetes    HPI Patient is in today for recheck on weight and T2DM.  Feels like Mounjaro 5 mg gave him indigestion this month and he would have a bad taste in his mouth. The fourth dose this month did a little bit better than the previous three of this dose.   He paid $530 for last month's prescription (prior Josem Kaufmann was denied).  He is taking Metformin 500 mg BID.  He has struggled with exercise and nutrition this month due to work schedule.  Past Medical History:  Diagnosis Date   Acute ST elevation myocardial infarction (STEMI) of inferior wall ([place]) 05/26/2019   dRCA occluded--DES PCI (distal PDA occluded with distalization.  Codominant circumflex patent.  With staged PCI of 90% mLAD; normal EF on echo   Coronary artery disease involving native coronary artery of native heart with unstable angina pectoris ([place]) 05/26/2019   a. 05/26/2019 PCI: 100% dRCA - DES PCI (Synergy DES 2.5 x 28 - 2.65 mm); pLAD focal 90%.  Co-dominant LCx, normal; b. 05/27/2019 Staged PCI: mLAD 90% (2.75x12 Synergy DES - 2.27mm).   Hyperlipidemia associated with type 2 diabetes mellitus ([place])    Morbid obesity with BMI of 45.0-49.9, adult ([place])    Tobacco abuse - 2PPD 05/26/2019   Type II diabetes mellitus with complication [facility])    Not currently taking medications    Past Surgical History:  Procedure Laterality Date   CORONARY STENT INTERVENTION N/A 05/27/2019   Procedure: CORONARY STENT INTERVENTION;  Surgeon: Leonie Man, MD;  Location: [place] CV LAB;  Service: Cardiovascular:: Staged PCI of mid LAD-Synergy DES 2.7 mm at 12 mm (2.9 mm).    CORONARY STENT INTERVENTION N/A 05/26/2019   Procedure: CORONARY STENT INTERVENTION;  Surgeon: Leonie Man, MD;  Location: [place] CV LAB;  Service: Cardiovascular:: distal RCA 100% (DES PCI Synergy 2.5 mm x 20 mm - 2.6  mm).   CORONARY/GRAFT ACUTE MI REVASCULARIZATION N/A 05/26/2019   Procedure: CORONARY/GRAFT ACUTE MI REVASCULARIZATION;  Surgeon: Leonie Man, MD;  Location: [place] CV LAB;  Service: Cardiovascular:: PCI of dRCA   LEFT HEART CATH AND CORONARY ANGIOGRAPHY N/A 05/26/2019   Procedure: LEFT HEART CATH AND CORONARY ANGIOGRAPHY;  Surgeon: Leonie Man, MD;  Location: [place] CV LAB;  Service: Cardiovascular:: dRCA 100% (DES PCI) with dRPDA 100% from distal embolization, mLAD 90% (Staged DES PCI).    TRANSTHORACIC ECHOCARDIOGRAM  05/27/2019   EF 60 to 65%.  Unable to assess wall motion because of poor images.  GR 1 DD.  Both atria are normal size.  Normal valves.   WRIST SURGERY      Family History  Problem Relation Age of Onset   Diabetes Mother    Heart attack Mother    CAD Mother    Diabetes Father    Cancer Father    Diabetes Brother     Social History   Tobacco Use   Smoking status: Former    Packs/day: 1.50    Years: 10.00    Pack years: 15.00    Types: Cigarettes    Quit date: 05/27/2019    Years since quitting: 2.3   Smokeless tobacco: Never  Vaping Use   Vaping Use: Never used  Substance Use Topics   Alcohol use: No   Drug use: No  No Known Allergies  Review of Systems NEGATIVE UNLESS OTHERWISE INDICATED IN HPI      Objective:     BP 125/86    Pulse 75    Temp 98 F (36.7 C)    Ht 6\' 2"  (1.88 m)    Wt (!) 350 lb 3.2 oz (158.8 kg)    SpO2 97%    BMI 44.96 kg/m   Wt Readings from Last 3 Encounters:  10/08/21 (!) 350 lb 3.2 oz (158.8 kg)  08/27/21 (!) 360 lb (163.3 kg)  07/26/21 (!) 386 lb 6.1 oz (175.3 kg)    BP Readings from Last 3 Encounters:  10/08/21 125/86  08/27/21 134/88  07/26/21 (!) 148/97     Physical Exam Vitals and nursing note reviewed.  Constitutional:      General: He is not in acute distress.    Appearance: Normal appearance. He is obese. He is not toxic-appearing.  HENT:     Head: Normocephalic and atraumatic.      Right Ear: External ear normal.     Left Ear: External ear normal.     Nose: Nose normal.     Mouth/Throat:     Mouth: Mucous membranes are moist.     Pharynx: Oropharynx is clear.  Eyes:     Extraocular Movements: Extraocular movements intact.     Conjunctiva/sclera: Conjunctivae normal.     Pupils: Pupils are equal, round, and reactive to light.  Cardiovascular:     Rate and Rhythm: Normal rate and regular rhythm.     Pulses: Normal pulses.     Heart sounds: Normal heart sounds.  Pulmonary:     Effort: Pulmonary effort is normal.     Breath sounds: Normal breath sounds.  Musculoskeletal:        General: Normal range of motion.     Cervical back: Normal range of motion and neck supple.     Right lower leg: No edema.     Left lower leg: No edema.  Skin:    General: Skin is warm and dry.  Neurological:     General: No focal deficit present.     Mental Status: He is alert and oriented to person, place, and time.     Sensory: No sensory deficit.  Psychiatric:        Mood and Affect: Mood normal.        Behavior: Behavior normal.       Assessment & Plan:   Problem List Items Addressed This Visit       Endocrine   Hyperlipidemia associated with type 2 diabetes mellitus (HCC) (Chronic)   Relevant Medications   metFORMIN (GLUCOPHAGE) 500 MG tablet   Type 2 diabetes mellitus with hyperglycemia, without long-term current use of insulin (HCC) - Primary   Relevant Medications   metFORMIN (GLUCOPHAGE) 500 MG tablet     Other   Morbid obesity (HCC) (Chronic)   Relevant Medications   metFORMIN (GLUCOPHAGE) 500 MG tablet     Meds ordered this encounter  Medications   metFORMIN (GLUCOPHAGE) 500 MG tablet    Sig: Take 2 tablets (1,000 mg total) by mouth 2 (two) times daily with a meal.    Dispense:  360 tablet    Refill:  0    1. Type 2 diabetes mellitus with hyperglycemia, without long-term current use of insulin (HCC) -Increase Metformin to 1000 mg BID, cautioned of  SE to watch for -Will stay off Mounjaro this month due to cost, despite his success with  it  2. Morbid obesity ([place]) -Encouraged him to keep working toward exercise (walking) daily and to start incorporating weight lifting. -Nutrition is still key and he needs to continue to work towards making wise food choices.  3. Hyperlipidemia associated with type 2 diabetes mellitus ([place]) -Doing well with Lipitor 80 mg.  Again he will continue to work on lifestyle changes.   Birtie Fellman M Zyon Grout, PA-C

## 2021-11-05 ENCOUNTER — Encounter: Payer: Self-pay | Admitting: Physician Assistant

## 2021-11-05 ENCOUNTER — Ambulatory Visit (INDEPENDENT_AMBULATORY_CARE_PROVIDER_SITE_OTHER): Payer: No Typology Code available for payment source | Admitting: Physician Assistant

## 2021-11-05 VITALS — BP 139/88 | HR 77 | Temp 98.2°F | Wt 357.4 lb

## 2021-11-05 DIAGNOSIS — E1165 Type 2 diabetes mellitus with hyperglycemia: Secondary | ICD-10-CM | POA: Diagnosis not present

## 2021-11-05 LAB — POCT GLYCOSYLATED HEMOGLOBIN (HGB A1C): Hemoglobin A1C: 6.2 % — AB (ref 4.0–5.6)

## 2021-11-05 NOTE — Progress Notes (Signed)
? ?Subjective:  ? ? Patient ID: David Bender, male    DOB: [birth year], 46 y.o.   MRN: [id number] ? ?Chief Complaint  ?Patient presents with  ? Diabetes  ?  At home checks 110-150 sugars   ? Weight Check  ?  Up 7 lbs since last visit   ? ? ?Diabetes ? ?Patient is in today for recheck of T2DM. Taking Metformin 1000 mg BID with meals. No side effects.  Patient had to stop taking Mounjaro due to cost of medication. ? ?States his glucose has been running 1 10-1 50 at home.  He has noted a 7 pound weight gain since last visit. ? ?Exercise is "here and there." Having trouble finding time to exercise, has been busy with work. Nutrition is better than it was.  ? ?No numbness or tingling in feet. No CP or SOB.  No vision changes.  No other complaints today. ? ?Past Medical History:  ?Diagnosis Date  ? Acute ST elevation myocardial infarction (STEMI) of inferior wall (HCC) 05/26/2019  ? dRCA occluded--DES PCI (distal PDA occluded with distalization.  Codominant circumflex patent.  With staged PCI of 90% mLAD; normal EF on echo  ? Coronary artery disease involving native coronary artery of native heart with unstable angina pectoris (HCC) 05/26/2019  ? a. 05/26/2019 PCI: 100% dRCA - DES PCI (Synergy DES 2.5 x 28 - 2.65 mm); pLAD focal 90%.  Co-dominant LCx, normal; b. 05/27/2019 Staged PCI: mLAD 90% (2.75x12 Synergy DES - 2.66mm).  ? Hyperlipidemia associated with type 2 diabetes mellitus (HCC)   ? Morbid obesity with BMI of 45.0-49.9, adult (HCC)   ? Tobacco abuse - 2PPD 05/26/2019  ? Type II diabetes mellitus with complication (HCC)   ? Not currently taking medications  ? ? ?Past Surgical History:  ?Procedure Laterality Date  ? CORONARY STENT INTERVENTION N/A 05/27/2019  ? Procedure: CORONARY STENT INTERVENTION;  Surgeon: Marykay Lex, MD;  Location: [facility] INVASIVE CV LAB;  Service: Cardiovascular:: Staged PCI of mid LAD-Synergy DES 2.7 mm at 12 mm (2.9 mm).   ? CORONARY STENT INTERVENTION N/A 05/26/2019  ? Procedure: CORONARY STENT  INTERVENTION;  Surgeon: Marykay Lex, MD;  Location: [facility] INVASIVE CV LAB;  Service: Cardiovascular:: distal RCA 100% (DES PCI Synergy 2.5 mm x 20 mm - 2.6 mm).  ? CORONARY/GRAFT ACUTE MI REVASCULARIZATION N/A 05/26/2019  ? Procedure: CORONARY/GRAFT ACUTE MI REVASCULARIZATION;  Surgeon: Marykay Lex, MD;  Location: [facility] INVASIVE CV LAB;  Service: Cardiovascular:: PCI of dRCA  ? LEFT HEART CATH AND CORONARY ANGIOGRAPHY N/A 05/26/2019  ? Procedure: LEFT HEART CATH AND CORONARY ANGIOGRAPHY;  Surgeon: Marykay Lex, MD;  Location: [facility] INVASIVE CV LAB;  Service: Cardiovascular:: dRCA 100% (DES PCI) with dRPDA 100% from distal embolization, mLAD 90% (Staged DES PCI).   ? TRANSTHORACIC ECHOCARDIOGRAM  05/27/2019  ? EF 60 to 65%.  Unable to assess wall motion because of poor images.  GR 1 DD.  Both atria are normal size.  Normal valves.  ? WRIST SURGERY    ? ? ?Family History  ?Problem Relation Age of Onset  ? Diabetes Mother   ? Heart attack Mother   ? CAD Mother   ? Diabetes Father   ? Cancer Father   ? Diabetes Brother   ? ? ?Social History  ? ?Tobacco Use  ? Smoking status: Former  ?  Packs/day: 1.50  ?  Years: 10.00  ?  Pack years: 15.00  ?  Types: Cigarettes  ?  Quit date: 05/27/2019  ?  Years since quitting: 2.4  ? Smokeless tobacco: Never  ?Vaping Use  ? Vaping Use: Never used  ?Substance Use Topics  ? Alcohol use: No  ? Drug use: No  ?  ? ?No Known Allergies ? ?Review of Systems ?NEGATIVE UNLESS OTHERWISE INDICATED IN HPI ? ? ?   ?Objective:  ?  ? ?BP 139/88   Pulse 77   Temp 98.2 ?F (36.8 ?C) (Temporal)   Wt (!) 357 lb 6.4 oz (162.1 kg)   SpO2 98%   BMI 45.89 kg/m?  ? ?Wt Readings from Last 3 Encounters:  ?11/05/21 (!) 357 lb 6.4 oz (162.1 kg)  ?10/08/21 (!) 350 lb 3.2 oz (158.8 kg)  ?08/27/21 (!) 360 lb (163.3 kg)  ? ? ?BP Readings from Last 3 Encounters:  ?11/05/21 139/88  ?10/08/21 125/86  ?08/27/21 134/88  ?  ? ?Physical Exam ?Vitals and nursing note reviewed.  ?Constitutional:   ?   General: He is not  in acute distress. ?   Appearance: Normal appearance. He is obese. He is not toxic-appearing.  ?HENT:  ?   Head: Normocephalic and atraumatic.  ?   Right Ear: External ear normal.  ?   Left Ear: External ear normal.  ?   Nose: Nose normal.  ?   Mouth/Throat:  ?   Mouth: Mucous membranes are moist.  ?   Pharynx: Oropharynx is clear.  ?Eyes:  ?   Extraocular Movements: Extraocular movements intact.  ?   Conjunctiva/sclera: Conjunctivae normal.  ?   Pupils: Pupils are equal, round, and reactive to light.  ?Cardiovascular:  ?   Rate and Rhythm: Normal rate and regular rhythm.  ?   Pulses: Normal pulses.  ?   Heart sounds: Normal heart sounds.  ?Pulmonary:  ?   Effort: Pulmonary effort is normal.  ?   Breath sounds: Normal breath sounds.  ?Musculoskeletal:     ?   General: Normal range of motion.  ?   Cervical back: Normal range of motion and neck supple.  ?   Right lower leg: No edema.  ?   Left lower leg: No edema.  ?Skin: ?   General: Skin is warm and dry.  ?Neurological:  ?   General: No focal deficit present.  ?   Mental Status: He is alert and oriented to person, place, and time.  ?   Sensory: No sensory deficit.  ?Psychiatric:     ?   Mood and Affect: Mood normal.     ?   Behavior: Behavior normal.  ? ? ?   ?Assessment & Plan:  ? ?Problem List Items Addressed This Visit   ? ?  ? Endocrine  ? Type 2 diabetes mellitus with hyperglycemia, without long-term current use of insulin (HCC) - Primary  ?  ? Other  ? Morbid obesity (HCC) (Chronic)  ? ? ?1. Type 2 diabetes mellitus with hyperglycemia, without long-term current use of insulin (HCC) ?2. Morbid obesity (HCC) ?Point-of-care hemoglobin A1c was 6.2, which is down from 7.4 three months ago.  Congratulated him on lowering his A1c.  He is going to stay on metformin 1000 mg twice daily.  Encouraged him to work really hard on lifestyle changes and plan to recheck fasting labs and medications in 3 months. ? ? ? ?This note was prepared with assistance of Software engineer. Occasional wrong-word or sound-a-like substitutions may have occurred due to the inherent limitations of voice recognition software. ? ? ? ?  Anushri Casalino M Corneshia Hines, PA-C ?

## 2021-11-05 NOTE — Addendum Note (Signed)
Addended by: Ronelle Nigh on: 11/05/2021 08:03 AM ? ? Modules accepted: Orders ? ?

## 2021-12-27 ENCOUNTER — Other Ambulatory Visit: Payer: Self-pay | Admitting: Physician Assistant

## 2021-12-27 DIAGNOSIS — E1169 Type 2 diabetes mellitus with other specified complication: Secondary | ICD-10-CM

## 2021-12-31 ENCOUNTER — Other Ambulatory Visit: Payer: Self-pay

## 2021-12-31 ENCOUNTER — Encounter (HOSPITAL_COMMUNITY): Payer: Self-pay | Admitting: Emergency Medicine

## 2021-12-31 ENCOUNTER — Emergency Department (HOSPITAL_COMMUNITY): Payer: No Typology Code available for payment source

## 2021-12-31 ENCOUNTER — Emergency Department (HOSPITAL_COMMUNITY)
Admission: EM | Admit: 2021-12-31 | Discharge: 2021-12-31 | Disposition: A | Discharge status: Home / Self Care | Payer: No Typology Code available for payment source | Attending: Emergency Medicine | Admitting: Emergency Medicine

## 2021-12-31 DIAGNOSIS — N200 Calculus of kidney: Secondary | ICD-10-CM | POA: Diagnosis not present

## 2021-12-31 DIAGNOSIS — Z7984 Long term (current) use of oral hypoglycemic drugs: Secondary | ICD-10-CM | POA: Diagnosis not present

## 2021-12-31 DIAGNOSIS — R079 Chest pain, unspecified: Secondary | ICD-10-CM | POA: Diagnosis not present

## 2021-12-31 DIAGNOSIS — I2510 Atherosclerotic heart disease of native coronary artery without angina pectoris: Secondary | ICD-10-CM | POA: Insufficient documentation

## 2021-12-31 DIAGNOSIS — D72829 Elevated white blood cell count, unspecified: Secondary | ICD-10-CM | POA: Insufficient documentation

## 2021-12-31 DIAGNOSIS — R1084 Generalized abdominal pain: Secondary | ICD-10-CM | POA: Diagnosis present

## 2021-12-31 DIAGNOSIS — I10 Essential (primary) hypertension: Secondary | ICD-10-CM | POA: Diagnosis not present

## 2021-12-31 DIAGNOSIS — Z7901 Long term (current) use of anticoagulants: Secondary | ICD-10-CM | POA: Insufficient documentation

## 2021-12-31 DIAGNOSIS — I714 Abdominal aortic aneurysm, without rupture, unspecified: Secondary | ICD-10-CM

## 2021-12-31 DIAGNOSIS — Z79899 Other long term (current) drug therapy: Secondary | ICD-10-CM | POA: Diagnosis not present

## 2021-12-31 DIAGNOSIS — E1165 Type 2 diabetes mellitus with hyperglycemia: Secondary | ICD-10-CM | POA: Insufficient documentation

## 2021-12-31 DIAGNOSIS — K5900 Constipation, unspecified: Secondary | ICD-10-CM | POA: Diagnosis not present

## 2021-12-31 LAB — CBC
HCT: 42.6 % (ref 39.0–52.0)
Hemoglobin: 14.8 g/dL (ref 13.0–17.0)
MCH: 30.6 pg (ref 26.0–34.0)
MCHC: 34.7 g/dL (ref 30.0–36.0)
MCV: 88.2 fL (ref 80.0–100.0)
Platelets: 269 10*3/uL (ref 150–400)
RBC: 4.83 MIL/uL (ref 4.22–5.81)
RDW: 12.3 % (ref 11.5–15.5)
WBC: 12.3 10*3/uL — ABNORMAL HIGH (ref 4.0–10.5)
nRBC: 0 % (ref 0.0–0.2)

## 2021-12-31 LAB — BASIC METABOLIC PANEL
Anion gap: 13 (ref 5–15)
BUN: 23 mg/dL — ABNORMAL HIGH (ref 6–20)
CO2: 25 mmol/L (ref 22–32)
Calcium: 9.7 mg/dL (ref 8.9–10.3)
Chloride: 103 mmol/L (ref 98–111)
Creatinine, Ser: 1.02 mg/dL (ref 0.61–1.24)
GFR, Estimated: >60 mL/min (ref >60)
Glucose, Bld: 180 mg/dL — ABNORMAL HIGH (ref 70–99)
Potassium: 4.3 mmol/L (ref 3.5–5.1)
Sodium: 141 mmol/L (ref 135–145)

## 2021-12-31 LAB — URINALYSIS, ROUTINE W REFLEX MICROSCOPIC
Bacteria, UA: NONE SEEN
Bilirubin Urine: NEGATIVE
Glucose, UA: NEGATIVE mg/dL
Ketones, ur: 5 mg/dL — AB
Leukocytes,Ua: NEGATIVE
Nitrite: NEGATIVE
Protein, ur: 30 mg/dL — AB
Specific Gravity, Urine: 1.027 (ref 1.005–1.030)
pH: 5 (ref 5.0–8.0)

## 2021-12-31 LAB — HEPATIC FUNCTION PANEL
ALT: 39 U/L (ref 0–44)
AST: 26 U/L (ref 15–41)
Albumin: 4.5 g/dL (ref 3.5–5.0)
Alkaline Phosphatase: 53 U/L (ref 38–126)
Bilirubin, Direct: 0.1 mg/dL (ref 0.0–0.2)
Indirect Bilirubin: 0.6 mg/dL (ref 0.3–0.9)
Total Bilirubin: 0.7 mg/dL (ref 0.3–1.2)
Total Protein: 7.6 g/dL (ref 6.5–8.1)

## 2021-12-31 LAB — TROPONIN I (HIGH SENSITIVITY)
Troponin I (High Sensitivity): 5 ng/L (ref <18)
Troponin I (High Sensitivity): 5 ng/L (ref <18)

## 2021-12-31 LAB — LIPASE, BLOOD: Lipase: 25 U/L (ref 11–51)

## 2021-12-31 MED ORDER — TAMSULOSIN HCL 0.4 MG PO CAPS: 0.4000 mg | ORAL_CAPSULE | Freq: Every day | ORAL | 0 refills | Status: AC

## 2021-12-31 MED ORDER — KETOROLAC TROMETHAMINE 30 MG/ML IJ SOLN
30.0000 mg | Freq: Once | INTRAMUSCULAR | Status: AC
  Administered 2021-12-31: 30 mg via INTRAVENOUS
  Filled 2021-12-31: qty 1

## 2021-12-31 MED ORDER — NAPROXEN 500 MG PO TABS: 500.0000 mg | ORAL_TABLET | Freq: Two times a day (BID) | ORAL | 0 refills | Status: DC

## 2021-12-31 MED ORDER — NITROGLYCERIN 0.4 MG SL SUBL: 0.4000 mg | SUBLINGUAL_TABLET | SUBLINGUAL | Status: DC | PRN

## 2021-12-31 MED ORDER — MORPHINE SULFATE (PF) 4 MG/ML IV SOLN
4.0000 mg | Freq: Once | INTRAVENOUS | Status: AC
Start: 2021-12-31 — End: 2021-12-31
  Administered 2021-12-31: 4 mg via INTRAVENOUS
  Filled 2021-12-31: qty 1

## 2021-12-31 MED ORDER — POLYETHYLENE GLYCOL 3350 17 G PO PACK: 17.0000 g | PACK | Freq: Every day | ORAL | Status: DC

## 2021-12-31 MED ORDER — IOHEXOL 350 MG/ML SOLN
100.0000 mL | Freq: Once | INTRAVENOUS | Status: AC | PRN
Start: 2021-12-31 — End: 2021-12-31
  Administered 2021-12-31: 100 mL via INTRAVENOUS

## 2021-12-31 NOTE — ED Notes (Signed)
Patient transported to CT 

## 2021-12-31 NOTE — Discharge Instructions (Addendum)
Discharge with pain medication discharge you were evaluated in the Emergency Department and after careful evaluation, we did not find any emergent condition requiring admission or further testing in the hospital. ? ?Your work-up today showed that you have a kidney stone on the right side.  Please take naproxen for pain, and take the Flomax to help facilitate passing the stone.  Please make sure to return to the ER if your pain is uncontrolled.  You may also take over-the-counter stool softeners including MiraLAX, senna, or magnesium oxide.  Given you follow-up information for Dr. Christene Slates with urology.  Please call the phone number on your discharge paperwork to schedule an appointment. ? ?Your CT scan also showed that you have a 4.1cm aneurysm of the ascending aorta. Please follow up with your primary care doctor about this for annual imaging to monitor this.  ? ? ?Please return to the Emergency Department if you experience any worsening of your condition.  We encourage you to follow up with a primary care provider.  Thank you for allowing [place] to be a part of your care. ? ?

## 2021-12-31 NOTE — ED Triage Notes (Signed)
Pt reported to ED with c/o right sided chest pain, abdominal pain and back pain since approximately 2030 this evening. Pt states "I feel like I'm about to explode". States he has had two heart attacks in the past and pain feels similar to those episodes. Endorses vomiting earlier in the evening and shortness of breath.  ?

## 2021-12-31 NOTE — ED Provider Notes (Addendum)
[facility] EMERGENCY DEPARTMENT Provider Note   CSN: [id number] Arrival date & time: 12/31/21  0007     History  Chief Complaint  Patient presents with   Chest Pain    David Bender is a 46 y.o. male.  HPI 46 year old male with a extensive cardiac history including inferior STEMI, CAD, heavy tobacco use, morbid obesity, hyperlipidemia with type 2 diabetes, hypertension presents to the ER with complaints of chest pain, back pain and abdominal pain which started at around 2030 this evening.  States "I feel like my abdomen is about to explode".  He endorses some mild nausea but no vomiting.  States the pain radiates into his back.  He denies any dizziness or syncope.  No known fevers or chills.  Denies any diarrhea, though states that he does feel constipated.  He has had no cough, no shortness of breath.  He continues to have pain on my evaluation upon arrival to the ER. He denies headache, blurry vision.     Home Medications Prior to Admission medications   Medication Sig Start Date End Date Taking? Authorizing Provider  naproxen (NAPROSYN) 500 MG tablet Take 1 tablet (500 mg total) by mouth 2 (two) times daily. 12/31/21  Yes Mare Ferrari, PA-C  tamsulosin (FLOMAX) 0.4 MG CAPS capsule Take 1 capsule (0.4 mg total) by mouth daily for 14 days. 12/31/21 01/14/22 Yes Mare Ferrari, PA-C  atorvastatin (LIPITOR) 80 MG tablet TAKE 1 TABLET BY MOUTH DAILY AT 6PM 07/01/21   Marykay Lex, MD  carvedilol (COREG) 12.5 MG tablet Take 1 tablet (12.5 mg total) by mouth 2 (two) times daily. 01/03/21 11/05/21  Marykay Lex, MD  clopidogrel (PLAVIX) 75 MG tablet TAKE ONE TABLET BY MOUTH DAILY 09/30/21   Marykay Lex, MD  losartan (COZAAR) 25 MG tablet TAKE ONE TABLET BY MOUTH DAILY 09/30/21   Marykay Lex, MD  metFORMIN (GLUCOPHAGE) 500 MG tablet TAKE TWO TABLETS BY MOUTH TWICE A DAY WITH MEAL 12/27/21   Allwardt, Crist Infante, PA-C  nitroGLYCERIN (NITROSTAT) 0.4 MG SL tablet Place 1  tablet (0.4 mg total) under the tongue every 5 (five) minutes as needed for chest pain (CP or SOB). 09/16/19   Marykay Lex, MD  VASCEPA 1 g capsule Take 2 capsules (2 g total) by mouth 2 (two) times daily. 08/01/21   Marykay Lex, MD      Allergies    Patient has no known allergies.    Review of Systems   Review of Systems Ten systems reviewed and are negative for acute change, except as noted in the HPI.   Physical Exam Updated Vital Signs BP (!) 142/80   Pulse 96   Temp 98.4 F (36.9 C) (Oral)   Resp (!) 25   Ht 6\' 2"  (1.88 m)   Wt (!) 165.6 kg   SpO2 94%   BMI 46.86 kg/m  Physical Exam Vitals and nursing note reviewed.  Constitutional:      General: He is not in acute distress.    Appearance: He is well-developed. He is obese.  HENT:     Head: Normocephalic and atraumatic.  Eyes:     Conjunctiva/sclera: Conjunctivae normal.  Cardiovascular:     Rate and Rhythm: Normal rate and regular rhythm.     Heart sounds: No murmur heard. Pulmonary:     Effort: Pulmonary effort is normal. No respiratory distress.     Breath sounds: Normal breath sounds.  Abdominal:  General: There is no abdominal bruit.     Palpations: Abdomen is soft.     Tenderness: There is abdominal tenderness.     Comments: Mild generalized abdominal tenderness, no CVA tenderness bilaterally, no rebound, no guarding, abdomen is soft  Musculoskeletal:        General: No swelling.     Cervical back: Neck supple.  Skin:    General: Skin is warm and dry.     Capillary Refill: Capillary refill takes less than 2 seconds.  Neurological:     General: No focal deficit present.     Mental Status: He is alert.  Psychiatric:        Mood and Affect: Mood normal.    ED Results / Procedures / Treatments   Labs (all labs ordered are listed, but only abnormal results are displayed) Labs Reviewed  BASIC METABOLIC PANEL - Abnormal; Notable for the following components:      Result Value   Glucose, Bld  180 (*)    BUN 23 (*)    All other components within normal limits  CBC - Abnormal; Notable for the following components:   WBC 12.3 (*)    All other components within normal limits  URINALYSIS, ROUTINE W REFLEX MICROSCOPIC - Abnormal; Notable for the following components:   Hgb urine dipstick MODERATE (*)    Ketones, ur 5 (*)    Protein, ur 30 (*)    All other components within normal limits  LIPASE, BLOOD  HEPATIC FUNCTION PANEL  TROPONIN I (HIGH SENSITIVITY)  TROPONIN I (HIGH SENSITIVITY)    EKG EKG Interpretation  Date/Time:  Tuesday Dec 31 2021 00:10:24 EDT Ventricular Rate:  92 PR Interval:  188 QRS Duration: 112 QT Interval:  376 QTC Calculation: 464 R Axis:   -30 Text Interpretation: Normal sinus rhythm Left axis deviation Minimal voltage criteria for LVH, may be normal variant ( R in aVL ) Inferior infarct , age undetermined Possible Anterolateral infarct , age undetermined Abnormal ECG When compared with ECG of 28-May-2019 07:30, PREVIOUS ECG IS PRESENT Confirmed by Ross Marcus ([id number]) on 12/31/2021 1:31:40 AM  Radiology DG Chest 2 View  Result Date: 12/31/2021 CLINICAL DATA:  Chest pain EXAM: CHEST - 2 VIEW COMPARISON:  05/26/2019 FINDINGS: Lung volumes are small and there is resultant vascular crowding at the hila. No confluent pulmonary infiltrate. No pneumothorax or pleural effusion. Cardiac size is within normal limits. No acute bone abnormality. IMPRESSION: Pulmonary hypoinflation Electronically Signed   By: Helyn Numbers M.D.   On: 12/31/2021 01:14   CT Angio Chest/Abd/Pel for Dissection W and/or Wo Contrast  Result Date: 12/31/2021 CLINICAL DATA:  Chest or back pain, aortic dissection suspected. EXAM: CT ANGIOGRAPHY CHEST, ABDOMEN AND PELVIS TECHNIQUE: Non-contrast CT of the chest was initially obtained. Multidetector CT imaging through the chest, abdomen and pelvis was performed using the standard protocol during bolus administration of intravenous contrast.  Multiplanar reconstructed images and MIPs were obtained and reviewed to evaluate the vascular anatomy. RADIATION DOSE REDUCTION: This exam was performed according to the departmental dose-optimization program which includes automated exposure control, adjustment of the mA and/or kV according to patient size and/or use of iterative reconstruction technique. CONTRAST:  OMNIPAQUE IOHEXOL 350 MG/ML SOLN COMPARISON:  None Available. FINDINGS: CTA CHEST FINDINGS Cardiovascular: The heart is normal in size and there is no pericardial effusion. Multi-vessel coronary artery calcifications are noted. There is mild aneurysmal dilatation of the ascending aorta measuring 4.1 cm. No definite dissection. Evaluation of the aorta  is limited due to motion artifact. The pulmonary trunk is normal in caliber. Mediastinum/Nodes: No enlarged mediastinal, hilar, or axillary lymph nodes. Calcified lymph nodes are present in the mediastinum and right hila. A subcentimeter hypodensity is present in the left lobe of the thyroid gland and too small to further characterize. The trachea and esophagus are within normal limits. Lungs/Pleura: Lungs are clear. No pleural effusion or pneumothorax. Musculoskeletal: Mild degenerative changes in the thoracic spine. No acute osseous abnormality. Review of the MIP images confirms the above findings. CTA ABDOMEN AND PELVIS FINDINGS VASCULAR Aorta: Mild aortic atherosclerosis. Normal caliber aorta without aneurysm, dissection, vasculitis or significant stenosis. Celiac: Patent without evidence of aneurysm, dissection, vasculitis or significant stenosis. SMA: Patent without evidence of aneurysm, dissection, vasculitis or significant stenosis. Renals: Both renal arteries are patent without evidence of aneurysm, dissection, vasculitis, fibromuscular dysplasia or significant stenosis. IMA: Patent without evidence of aneurysm, dissection, vasculitis or significant stenosis. Inflow: Patent without evidence  of aneurysm, dissection, vasculitis or significant stenosis. Veins: No obvious venous abnormality within the limitations of this arterial phase study. Review of the MIP images confirms the above findings. NON-VASCULAR Hepatobiliary: No focal liver abnormality is seen. Mild hepatic steatosis. No gallstones, gallbladder wall thickening, or biliary dilatation. Pancreas: Unremarkable. No pancreatic ductal dilatation or surrounding inflammatory changes. Spleen: Normal in size without focal abnormality. Adrenals/Urinary Tract: The adrenal glands are within normal limits. No renal calculus bilaterally. There is a slightly delayed nephrogram on the right with perinephric and periureteral fat stranding. There is mild obstructive uropathy with a 3 mm calculus in the mid right ureter. The bladder is unremarkable. Stomach/Bowel: Stomach is within normal limits. Appendix appears normal. No evidence of bowel wall thickening, distention, or inflammatory changes. No free air or pneumatosis. Lymphatic: No abdominal or pelvic lymphadenopathy. Reproductive: Prostate gland is enlarged. Other: No abdominal wall hernia or abnormality. No abdominopelvic ascites. Musculoskeletal: No acute osseous abnormality. Review of the MIP images confirms the above findings. IMPRESSION: 1. No evidence of aortic dissection. 2. Mild obstructive uropathy on the right with a 3 mm calculus in the mid right ureter. 3. Mild aneurysmal dilatation of the ascending aorta measuring 4.1 cm. Recommend annual imaging followup by CTA or MRA. This recommendation follows 2010 ACCF/AHA/AATS/ACR/ASA/SCA/SCAI/SIR/STS/SVM Guidelines for the Diagnosis and Management of Patients with Thoracic Aortic Disease. Circulation. 2010; 121: N562-Z308. Aortic aneurysm NOS (ICD10-I71.9) 4. Enlarged prostate gland. 5. Multi-vessel coronary artery calcifications. Electronically Signed   By: Thornell Sartorius M.D.   On: 12/31/2021 03:25    Procedures Procedures    Medications Ordered in  ED Medications  nitroGLYCERIN (NITROSTAT) SL tablet 0.4 mg (has no administration in time range)  polyethylene glycol (MIRALAX / GLYCOLAX) packet 17 g (has no administration in time range)  morphine (PF) 4 MG/ML injection 4 mg (4 mg Intravenous Given 12/31/21 0204)  iohexol (OMNIPAQUE) 350 MG/ML injection 100 mL (100 mLs Intravenous Contrast Given 12/31/21 0242)  ketorolac (TORADOL) 30 MG/ML injection 30 mg (30 mg Intravenous Given 12/31/21 0355)    ED Course/ Medical Decision Making/ A&P                           Medical Decision Making Amount and/or Complexity of Data Reviewed Labs: ordered. Radiology: ordered.  Risk Prescription drug management.   This patient presents to the ED for concern of chest pain, abdominal pain, back pain this involves a number of treatment options, and is a complaint that carries with it a high risk  of complications and morbidity.  The differential diagnosis includes ACS, PE, aortic dissection, SBO, appendicits, diverticulitis, constipation, renal stone, UTI   Co morbidities: Discussed in HPI   Brief History:  46 year old male presenting to the ER with complaints of chest pain, back pain and abdominal pain began earlier this evening  EMR reviewed including pt PMHx, past surgical history and past visits to ER.   See HPI for more details   Lab Tests:  I ordered and independently interpreted labs.  The pertinent results include:    Labs notable for CBC with a mild leukocytosis of 12.3, normal hemoglobin BMP without any significant electrode abnormalities, glucose of 180, BUN of 23, creatinine of 1.02, normal anion gap Hepatic function panel without any LFT elevation, normal total bilirubin Lipase normal Delta troponin negative UA with moderate hemoglobin, ketonuria, proteinuria, no evidence of UTI  Imaging Studies:  Abnormal findings. I personally reviewed all imaging studies. Imaging notable for  Chest x-ray with pulmonary hypoinflation CTA  of the chest abdomen and pelvis with   IMPRESSION:  1. No evidence of aortic dissection.  2. Mild obstructive uropathy on the right with a 3 mm calculus in  the mid right ureter.  3. Mild aneurysmal dilatation of the ascending aorta measuring 4.1  cm. Recommend annual imaging followup by CTA or MRA. This  recommendation follows 2010  ACCF/AHA/AATS/ACR/ASA/SCA/SCAI/SIR/STS/SVM Guidelines for the  Diagnosis and Management of Patients with Thoracic Aortic Disease.  Circulation. 2010; 121: Y403-K742. Aortic aneurysm NOS (ICD10-I71.9)  4. Enlarged prostate gland.  5. Multi-vessel coronary artery calcifications.       Cardiac Monitoring:  The patient was maintained on a cardiac monitor.  I personally viewed and interpreted the cardiac monitored which showed an underlying rhythm of: EKG non-ischemic   Medicines ordered:  I ordered medication including morphine, Toradol for pain.  MiraLAX for constipation. Reevaluation of the patient after these medicines showed that the patient improved I have reviewed the patients home medicines and have made adjustments as needed   Critical Interventions:  NA   Consults: NA    Reevaluation:  After the interventions noted above I re-evaluated patient and found that they have :improved   Social Determinants of Health:  N/A    Problem List / ED Course:  46 year old male presenting with chest pain, abdominal pain and back pain.  He has an extensive cardiac history.  His exam is overall unremarkable, he has mild generalized abdominal tenderness, no guarding, abdomen is soft, no CVA tenderness.  His lab work is also fairly without any acute abnormalities, CBC with a leukocytosis of 12.3, could be infectious versus reactive, as he is afebrile.  Hepatic function panel not consistent with cholecystitis.  Lipase normal not consistent with pancreatitis.  BMP with no electrolyte abnormalities, normal renal function, glucose of 180 but no anion gap  or acidosis.  UA with moderate hemoglobin, ketones, proteinuria.  His chest x-ray showed some hypoinflation but no evidence of pneumonia.  His EKG is nonischemic.  Given patient's cardiac history and concerning presentation, I did order CTA of the chest abdomen and pelvis to rule out a dissection.  This showed a 3 mm obstructive stone in the mid right ureter.  Patient was given morphine, Toradol for pain with improvement.  He did say that he felt constipated, for MiraLAX which he ended up refusing.  On my reevaluation, patient feels improved and would like to go home.  I have low suspicion for ACS given negative troponins and EKG, no evidence of  dissection on CTA, no evidence of SBO or UTI. No evidence of mesenteric ischemia on CTA. No evidence of hypertensive urgency/emergency.   Will send home with naproxen and Flomax and urology follow-up.  Also encouraged MiraLAX for constipation.  Dispostion:  After consideration of the diagnostic results and the patients response to treatment, I feel that the patent would benefit from discharge w/ pain management, urology followup and strict return precautions    Final Clinical Impression(s) / ED Diagnoses Final diagnoses:  Right kidney stone  Constipation, unspecified constipation type    Rx / DC Orders ED Discharge Orders          Ordered    tamsulosin (FLOMAX) 0.4 MG CAPS capsule  Daily        12/31/21 0500    naproxen (NAPROSYN) 500 MG tablet  2 times daily        12/31/21 0500           \    Mare Ferrari, PA-C 12/31/21 0505    Shon Baton, MD 12/31/21 [phone]

## 2021-12-31 NOTE — ED Notes (Signed)
RN reviewed discharge instructions with pt. Pt verbalized understanding and had no further questions. VSS upon discharge.  

## 2022-01-01 ENCOUNTER — Ambulatory Visit (INDEPENDENT_AMBULATORY_CARE_PROVIDER_SITE_OTHER): Payer: No Typology Code available for payment source | Admitting: Family

## 2022-01-01 ENCOUNTER — Encounter: Payer: Self-pay | Admitting: Family

## 2022-01-01 VITALS — BP 124/85 | HR 90 | Temp 97.7°F | Ht 74.0 in | Wt 368.0 lb

## 2022-01-01 DIAGNOSIS — N201 Calculus of ureter: Secondary | ICD-10-CM | POA: Diagnosis not present

## 2022-01-01 DIAGNOSIS — I7121 Aneurysm of the ascending aorta, without rupture: Secondary | ICD-10-CM | POA: Insufficient documentation

## 2022-01-01 MED ORDER — KETOROLAC TROMETHAMINE 10 MG PO TABS: 10.0000 mg | ORAL_TABLET | Freq: Four times a day (QID) | ORAL | 0 refills | Status: DC | PRN

## 2022-01-01 NOTE — Assessment & Plan Note (Signed)
found on CT scan in ER - 4.1cm  ?

## 2022-01-01 NOTE — Patient Instructions (Addendum)
It was very nice to see you today! ? ?I have sent ketorolac pills to your pharmacy to help with your pain. Do not take more than 4 pills in 24 hours, and only take as needed. ?Do not take with the Naproxen. ?Drink at least 2 liters water daily to help the stone pass quickly and also for overall good health! ? ? ?PLEASE NOTE: ? ?If you had any lab tests please let us know if you have not heard back within a few days. You may see your results on MyChart before we have a chance to review them but we will give you a call once they are reviewed by [place]. If we ordered any referrals today, please let us know if you have not heard from their office within the next week.  ? ?Please try these tips to maintain a healthy lifestyle: ? ?Eat most of your calories during the day when you are active. Eliminate processed foods including packaged sweets (pies, cakes, cookies), reduce intake of potatoes, white bread, white pasta, and white rice. Look for whole grain options, oat flour or almond flour. ? ?Each meal should contain half fruits/vegetables, one quarter protein, and one quarter carbs (no bigger than a computer mouse). ? ?Cut down on sweet beverages. This includes juice, soda, and sweet tea. Also watch fruit intake, though this is a healthier sweet option, it still contains natural sugar! Limit to 3 servings daily. ? ?Drink at least 1 glass of water with each meal and aim for at least 8 glasses per day ? ?Exercise at least 150 minutes every week.  ? ?

## 2022-01-01 NOTE — Progress Notes (Signed)
? ?Subjective:  ? ? ? Patient ID: David Bender, male    DOB: [birth year], 45 y.o.   MRN: [id number] ? ?Chief Complaint  ?Patient presents with  ? Abdominal Pain  ?  Pt was seen in ER lastnight due to kidney stone and pain on right side and abdomen, he  was given naproxen and an injection which did help. Pt states he is in pain again.   ? Penis Pain  ?  Pt c/o burning sensation and discomfort in penis. For a couple hours.   ? ?HPI: ?Pain ?He reports new onset back, side pain. There was not an injury that may have caused the pain. The pain started yesterday and is staying constant. The pain does not radiate. The pain is described as sharp and stabbing, is 8/10 in intensity, occurring intermittently.   ?Aggravating factors: none He has tried NSAIDs with mild relief.  Seen in ER last night, found 107mm stone in right ureter, given Toradol injection, Naproxen & Flomax RX, reports Naproxen does not help his pain.  ? ? ?Assessment & Plan:  ? ?Problem List Items Addressed This Visit   ? ?  ? Cardiovascular and Mediastinum  ? Aneurysm of ascending aorta without rupture (HCC)  ?  found on CT scan in ER - 4.1cm  ? ?  ?  ? ?Other Visit Diagnoses   ? ? Calculus of ureter    -  Primary  ? seen in ER during the night, given toradol injection and advised to f/u with Urology, pt reports severe pain again today with right low back and flank area, taking Flomax as directed, sending oral Ketorolac, advised on use & SE, take only prn for pain, do not take with the Naproxen.  ? ?Relevant Medications  ? ketorolac (TORADOL) 10 MG tablet  ? ?  ? ?Outpatient Medications Prior to Visit  ?Medication Sig Dispense Refill  ? atorvastatin (LIPITOR) 80 MG tablet TAKE 1 TABLET BY MOUTH DAILY AT 6PM 90 tablet 3  ? clopidogrel (PLAVIX) 75 MG tablet TAKE ONE TABLET BY MOUTH DAILY 90 tablet 3  ? losartan (COZAAR) 25 MG tablet TAKE ONE TABLET BY MOUTH DAILY 90 tablet 3  ? metFORMIN (GLUCOPHAGE) 500 MG tablet TAKE TWO TABLETS BY MOUTH TWICE A DAY WITH MEAL  360 tablet 0  ? naproxen (NAPROSYN) 500 MG tablet Take 1 tablet (500 mg total) by mouth 2 (two) times daily. 30 tablet 0  ? nitroGLYCERIN (NITROSTAT) 0.4 MG SL tablet Place 1 tablet (0.4 mg total) under the tongue every 5 (five) minutes as needed for chest pain (CP or SOB). 25 tablet 3  ? tamsulosin (FLOMAX) 0.4 MG CAPS capsule Take 1 capsule (0.4 mg total) by mouth daily for 14 days. 14 capsule 0  ? VASCEPA 1 g capsule Take 2 capsules (2 g total) by mouth 2 (two) times daily. 360 capsule 3  ? carvedilol (COREG) 12.5 MG tablet Take 1 tablet (12.5 mg total) by mouth 2 (two) times daily. 180 tablet 3  ? ?No facility-administered medications prior to visit.  ? ? ?Past Medical History:  ?Diagnosis Date  ? Acute ST elevation myocardial infarction (STEMI) of inferior wall (HCC) 05/26/2019  ? dRCA occluded--DES PCI (distal PDA occluded with distalization.  Codominant circumflex patent.  With staged PCI of 90% mLAD; normal EF on echo  ? Coronary artery disease involving native coronary artery of native heart with unstable angina pectoris (HCC) 05/26/2019  ? a. 05/26/2019 PCI: 100% dRCA - DES PCI (Synergy  DES 2.5 x 28 - 2.65 mm); pLAD focal 90%.  Co-dominant LCx, normal; b. 05/27/2019 Staged PCI: mLAD 90% (2.75x12 Synergy DES - 2.66mm).  ? Hyperlipidemia associated with type 2 diabetes mellitus (HCC)   ? Morbid obesity with BMI of 45.0-49.9, adult (HCC)   ? Tobacco abuse - 2PPD 05/26/2019  ? Type II diabetes mellitus with complication (HCC)   ? Not currently taking medications  ? ? ?Past Surgical History:  ?Procedure Laterality Date  ? CORONARY STENT INTERVENTION N/A 05/27/2019  ? Procedure: CORONARY STENT INTERVENTION;  Surgeon: Marykay Lex, MD;  Location: [facility] INVASIVE CV LAB;  Service: Cardiovascular:: Staged PCI of mid LAD-Synergy DES 2.7 mm at 12 mm (2.9 mm).   ? CORONARY STENT INTERVENTION N/A 05/26/2019  ? Procedure: CORONARY STENT INTERVENTION;  Surgeon: Marykay Lex, MD;  Location: [facility] INVASIVE CV LAB;  Service:  Cardiovascular:: distal RCA 100% (DES PCI Synergy 2.5 mm x 20 mm - 2.6 mm).  ? CORONARY/GRAFT ACUTE MI REVASCULARIZATION N/A 05/26/2019  ? Procedure: CORONARY/GRAFT ACUTE MI REVASCULARIZATION;  Surgeon: Marykay Lex, MD;  Location: [facility] INVASIVE CV LAB;  Service: Cardiovascular:: PCI of dRCA  ? LEFT HEART CATH AND CORONARY ANGIOGRAPHY N/A 05/26/2019  ? Procedure: LEFT HEART CATH AND CORONARY ANGIOGRAPHY;  Surgeon: Marykay Lex, MD;  Location: [facility] INVASIVE CV LAB;  Service: Cardiovascular:: dRCA 100% (DES PCI) with dRPDA 100% from distal embolization, mLAD 90% (Staged DES PCI).   ? TRANSTHORACIC ECHOCARDIOGRAM  05/27/2019  ? EF 60 to 65%.  Unable to assess wall motion because of poor images.  GR 1 DD.  Both atria are normal size.  Normal valves.  ? WRIST SURGERY    ? ? ?No Known Allergies ? ?   ?Objective:  ?  ?Physical Exam ?Vitals and nursing note reviewed.  ?Constitutional:   ?   General: He is not in acute distress. ?   Appearance: Normal appearance. He is morbidly obese.  ?HENT:  ?   Head: Normocephalic.  ?Cardiovascular:  ?   Rate and Rhythm: Normal rate and regular rhythm.  ?Pulmonary:  ?   Effort: Pulmonary effort is normal.  ?   Breath sounds: Normal breath sounds.  ?Musculoskeletal:     ?   General: Normal range of motion.  ?   Cervical back: Normal range of motion.  ?Skin: ?   General: Skin is warm and dry.  ?Neurological:  ?   Mental Status: He is alert and oriented to person, place, and time.  ?Psychiatric:     ?   Mood and Affect: Mood normal.  ? ? ?BP 124/85 (BP Location: Left Arm, Patient Position: Sitting, Cuff Size: Large)   Pulse 90   Temp 97.7 ?F (36.5 ?C) (Temporal)   Ht 6\' 2"  (1.88 m)   Wt (!) 368 lb (166.9 kg)   SpO2 99%   BMI 47.25 kg/m?  ?Wt Readings from Last 3 Encounters:  ?01/01/22 (!) 368 lb (166.9 kg)  ?12/31/21 (!) 365 lb (165.6 kg)  ?11/05/21 (!) 357 lb 6.4 oz (162.1 kg)  ? ? ? ?Meds ordered this encounter  ?Medications  ? ketorolac (TORADOL) 10 MG tablet  ?  Sig: Take 1  tablet (10 mg total) by mouth every 6 (six) hours as needed.  ?  Dispense:  24 tablet  ?  Refill:  0  ?  Order Specific Question:   Supervising Provider  ?  Answer:   ANDY, CAMILLE L [[id number]]  ? ? ?11/07/21, NP ? ?

## 2022-02-04 ENCOUNTER — Ambulatory Visit (INDEPENDENT_AMBULATORY_CARE_PROVIDER_SITE_OTHER): Payer: No Typology Code available for payment source | Admitting: Physician Assistant

## 2022-02-04 ENCOUNTER — Encounter: Payer: Self-pay | Admitting: Physician Assistant

## 2022-02-04 VITALS — BP 136/85 | HR 85 | Temp 97.1°F | Ht 74.0 in | Wt 364.8 lb

## 2022-02-04 DIAGNOSIS — I2510 Atherosclerotic heart disease of native coronary artery without angina pectoris: Secondary | ICD-10-CM

## 2022-02-04 DIAGNOSIS — N400 Enlarged prostate without lower urinary tract symptoms: Secondary | ICD-10-CM

## 2022-02-04 DIAGNOSIS — I10 Essential (primary) hypertension: Secondary | ICD-10-CM

## 2022-02-04 DIAGNOSIS — Z9861 Coronary angioplasty status: Secondary | ICD-10-CM

## 2022-02-04 DIAGNOSIS — I7121 Aneurysm of the ascending aorta, without rupture: Secondary | ICD-10-CM

## 2022-02-04 DIAGNOSIS — E1165 Type 2 diabetes mellitus with hyperglycemia: Secondary | ICD-10-CM | POA: Diagnosis not present

## 2022-02-04 LAB — POCT GLYCOSYLATED HEMOGLOBIN (HGB A1C): Hemoglobin A1C: 6.4 % — AB (ref 4.0–5.6)

## 2022-02-04 MED ORDER — EMPAGLIFLOZIN 10 MG PO TABS
10.0000 mg | ORAL_TABLET | Freq: Every day | ORAL | 0 refills | Status: AC
Start: 2022-02-04 — End: 2022-05-05

## 2022-02-04 NOTE — Patient Instructions (Addendum)
Good to see you, thanks for coming in. Please let us know if you need help with any grief counseling.  Continue on current medication regimen. Add Jardiance 10 mg before breakfast daily.  Keep working on lifestyle goals.  Repeat imaging for aneurysm in May 2024.   Call sooner if any concerns

## 2022-02-04 NOTE — Progress Notes (Signed)
Subjective:    Patient ID: David Bender, male    DOB: 12/30/1975, 46 y.o.   MRN: [id number]  Chief Complaint  Patient presents with   Follow-up    Pt being seen for 3 mon f/u; pt states he has been doing well and no concerns to discuss; pt is going to call to schedule diabetic eye exam and denies any break down of feet or ulcers;    HPI Patient is in today for 3 month f/up. States the last month has been difficult after losing his mother.   Interim Hx: Patient was seen at the emergency department on 12/31/2021 for kidney stone.  States that he thinks he passed to this as he is no longer experiencing any pain.  This was his first kidney stone he ever had. CT angio chest / abd / pelv for dissection w/ and w/o contrast showed the following:  IMPRESSION: 1. No evidence of aortic dissection. 2. Mild obstructive uropathy on the right with a 3 mm calculus in the mid right ureter. 3. Mild aneurysmal dilatation of the ascending aorta measuring 4.1 cm. Recommend annual imaging followup by CTA or MRA. This recommendation follows 2010 ACCF/AHA/AATS/ACR/ASA/SCA/SCAI/SIR/STS/SVM Guidelines for the Diagnosis and Management of Patients with Thoracic Aortic Disease. Circulation. 2010; 121: N829-F621: E266-e369. Aortic aneurysm NOS (ICD10-I71.9) 4. Enlarged prostate gland. 5. Multi-vessel coronary artery calcifications. I also asked the patient about any prostate symptoms, he denies any urinary frequency, postvoid dribbling, urinary hesitancy.  Occasional nocturia.  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 -Denies any concerning symptoms   Past Medical History:  Diagnosis Date   Acute ST elevation myocardial infarction (STEMI) of inferior wall (HCC) 05/26/2019   dRCA occluded--DES PCI (distal PDA occluded with  distalization.  Codominant circumflex patent.  With staged PCI of 90% mLAD; normal EF on echo   Coronary artery disease involving native coronary artery of native heart with unstable angina pectoris (HCC) 05/26/2019   a. 05/26/2019 PCI: 100% dRCA - DES PCI (Synergy DES 2.5 x 28 - 2.65 mm); pLAD focal 90%.  Co-dominant LCx, normal; b. 05/27/2019 Staged PCI: mLAD 90% (2.75x12 Synergy DES - 2.539mm).   Hyperlipidemia associated with type 2 diabetes mellitus (HCC)    Kidney stone    2023   Morbid obesity with BMI of 45.0-49.9, adult (HCC)    Tobacco abuse - 2PPD 05/26/2019   Type II diabetes mellitus with complication (HCC)    Not currently taking medications    Past Surgical History:  Procedure Laterality Date   CORONARY STENT INTERVENTION N/A 05/27/2019   Procedure: CORONARY STENT INTERVENTION;  Surgeon: Marykay LexHarding, David W, MD;  [facility];  Service: Cardiovascular:: Staged PCI of mid LAD-Synergy DES 2.7 mm at 12 mm (2.9 mm).    CORONARY STENT INTERVENTION N/A 05/26/2019   Procedure: CORONARY STENT INTERVENTION;  Surgeon: Marykay LexHarding, David W, MD;  [facility];  Service: Cardiovascular:: distal RCA 100% (DES PCI Synergy 2.5 mm x 20 mm - 2.6 mm).   CORONARY/GRAFT ACUTE MI REVASCULARIZATION N/A 05/26/2019   Procedure: CORONARY/GRAFT ACUTE MI REVASCULARIZATION;  Surgeon: Marykay LexHarding, David W, MD;  [facility];  Service: Cardiovascular:: PCI of dRCA   LEFT HEART CATH AND CORONARY ANGIOGRAPHY N/A 05/26/2019   Procedure: LEFT HEART CATH AND CORONARY ANGIOGRAPHY;  Surgeon: Marykay LexHarding, David W, MD;  Location: [facility] IN[facility] CV LAB;  Service: Cardiovascular:: dRCA 100% (DES PCI) with dRPDA 100% from distal embolization, mLAD 90% (Staged DES PCI).    TRANSTHORACIC ECHOCARDIOGRAM  05/27/2019   EF 60 to 65%.  Unable to assess wall motion because of poor images.  GR 1 DD.  Both atria are normal size.  Normal valves.   WRIST SURGERY      Family History  Problem Relation Age of  Onset   Diabetes Mother    Heart attack Mother    CAD Mother    Diabetes Father    Cancer Father    Diabetes Brother     Social History   Tobacco Use   Smoking status: Former    Packs/day: 1.50    Years: 10.00    Total pack years: 15.00    Types: Cigarettes    Quit date: 05/27/2019    Years since quitting: 2.6   Smokeless tobacco: Never  Vaping Use   Vaping Use: Never used  Substance Use Topics   Alcohol use: No   Drug use: No     No Known Allergies  Review of Systems NEGATIVE UNLESS OTHERWISE INDICATED IN HPI      Objective:     BP 136/85 (BP Location: Left Arm)   Pulse 85   Temp (!) 97.1 F (36.2 C) (Temporal)   Ht 6\' 2"  (1.88 m)   Wt (!) 364 lb 12.8 oz (165.5 kg)   SpO2 98%   BMI 46.84 kg/m   Wt Readings from Last 3 Encounters:  02/04/22 (!) 364 lb 12.8 oz (165.5 kg)  01/01/22 (!) 368 lb (166.9 kg)  12/31/21 (!) 365 lb (165.6 kg)    BP Readings from Last 3 Encounters:  02/04/22 136/85  01/01/22 124/85  12/31/21 (!) 142/91     Physical Exam Vitals and nursing note reviewed.  Constitutional:      General: He is not in acute distress.    Appearance: Normal appearance. He is obese. He is not toxic-appearing.  HENT:     Head: Normocephalic and atraumatic.     Right Ear: External ear normal.  Eyes:     Extraocular Movements: Extraocular movements intact.     Conjunctiva/sclera: Conjunctivae normal.     Pupils: Pupils are equal, round, and reactive to light.  Cardiovascular:     Rate and Rhythm: Normal rate and regular rhythm.     Pulses: Normal pulses.     Heart sounds: Normal heart sounds.  Pulmonary:     Effort: Pulmonary effort is normal.     Breath sounds: Normal breath sounds.  Musculoskeletal:     Cervical back: Normal range of motion and neck supple.     Right lower leg: No edema.     Left lower leg: No edema.  Skin:    General: Skin is warm and dry.     Findings: No lesion or rash.  Neurological:     General: No focal deficit  present.     Mental Status: He is alert and oriented to person, place, and time.     Sensory: No sensory deficit.  Psychiatric:        Mood and Affect: Mood normal.        Behavior: Behavior normal.        Assessment & Plan:   Problem List Items Addressed This Visit       Cardiovascular and Mediastinum   CAD -S/P PCI (Chronic)   Essential hypertension (Chronic)   Aneurysm of  ascending aorta without rupture (HCC)     Endocrine   Type 2 diabetes mellitus with hyperglycemia, without long-term current use of insulin (HCC) - Primary   Relevant Medications   empagliflozin (JARDIANCE) 10 MG TABS tablet   Other Relevant Orders   POCT HgB A1C (Completed)     Other   Morbid obesity (HCC)   Relevant Medications   empagliflozin (JARDIANCE) 10 MG TABS tablet   Enlarged prostate     Meds ordered this encounter  Medications   empagliflozin (JARDIANCE) 10 MG TABS tablet    Sig: Take 1 tablet (10 mg total) by mouth daily before breakfast.    Dispense:  90 tablet    Refill:  0    Order Specific Question:   Supervising Provider    Answer:   Tana Conch O [[id number]]    1. Type 2 diabetes mellitus with hyperglycemia, without long-term current use of insulin (HCC) Lab Results  Component Value Date   HGBA1C 6.4 (A) 02/04/2022   Doing well - continue on Metformin 500 mg 2 tab BID;  - add Jardiance 10 mg daily for CVD event reduction  - Pt aware of risks vs benefits and possible adverse reactions - Cont to work on lifestyle  2. Essential hypertension BP is doing well, holding stable -Cont Losartan 25 mg daily -limit salt   3. Morbid obesity (HCC) - Cont to work on lifestyle   4. CAD -S/P PCI 5. Aneurysm of ascending aorta without rupture [facility]) Per note on CT 12/31/21 - WILL PLAN FOR REPEAT IN MAY 2024: 3. Mild aneurysmal dilatation of the ascending aorta measuring 4.1 cm. Recommend annual imaging followup by CTA or MRA. This recommendation follows  2010 ACCF/AHA/AATS/ACR/ASA/SCA/SCAI/SIR/STS/SVM Guidelines for the Diagnosis and Management of Patients with Thoracic Aortic Disease. Circulation. 2010; 121: I712-W580. Aortic aneurysm NOS (ICD10-I71.9)  6. Enlarged prostate - Reported per CT last month. Discussed with pt - no symptoms, but would advise checking PSA level with next labs, he agrees.    Return in about 3 months (around 05/07/2022) for fasting labs, recheck, PSA level.  This note was prepared with assistance of Conservation officer, historic buildings. Occasional wrong-word or sound-a-like substitutions may have occurred due to the inherent limitations of voice recognition software.   Lauren Modisette M Equilla Que, PA-C

## 2022-02-17 ENCOUNTER — Other Ambulatory Visit: Payer: Self-pay | Admitting: Cardiology

## 2022-02-17 DIAGNOSIS — I2510 Atherosclerotic heart disease of native coronary artery without angina pectoris: Secondary | ICD-10-CM

## 2022-02-17 DIAGNOSIS — I2119 ST elevation (STEMI) myocardial infarction involving other coronary artery of inferior wall: Secondary | ICD-10-CM

## 2022-03-26 ENCOUNTER — Other Ambulatory Visit: Payer: Self-pay | Admitting: Physician Assistant

## 2022-03-26 DIAGNOSIS — E1169 Type 2 diabetes mellitus with other specified complication: Secondary | ICD-10-CM

## 2022-04-25 ENCOUNTER — Ambulatory Visit: Payer: No Typology Code available for payment source | Admitting: Physician Assistant

## 2022-05-19 ENCOUNTER — Encounter: Payer: Self-pay | Admitting: *Deleted

## 2022-06-23 ENCOUNTER — Other Ambulatory Visit: Payer: Self-pay | Admitting: Physician Assistant

## 2022-06-23 ENCOUNTER — Other Ambulatory Visit: Payer: Self-pay | Admitting: Cardiology

## 2022-06-23 DIAGNOSIS — E1169 Type 2 diabetes mellitus with other specified complication: Secondary | ICD-10-CM

## 2022-08-07 ENCOUNTER — Encounter: Payer: Self-pay | Admitting: *Deleted

## 2022-08-28 ENCOUNTER — Other Ambulatory Visit: Payer: Self-pay | Admitting: Cardiology

## 2022-09-24 ENCOUNTER — Other Ambulatory Visit: Payer: Self-pay | Admitting: Cardiology

## 2022-09-25 ENCOUNTER — Other Ambulatory Visit: Payer: Self-pay | Admitting: Physician Assistant

## 2022-09-25 DIAGNOSIS — E1169 Type 2 diabetes mellitus with other specified complication: Secondary | ICD-10-CM

## 2022-10-20 ENCOUNTER — Ambulatory Visit: Payer: No Typology Code available for payment source | Attending: Nurse Practitioner | Admitting: Nurse Practitioner

## 2022-10-20 ENCOUNTER — Encounter: Payer: Self-pay | Admitting: Nurse Practitioner

## 2022-10-20 VITALS — BP 154/110 | HR 82 | Ht 74.0 in | Wt 380.0 lb

## 2022-10-20 DIAGNOSIS — I10 Essential (primary) hypertension: Secondary | ICD-10-CM | POA: Diagnosis not present

## 2022-10-20 DIAGNOSIS — I2119 ST elevation (STEMI) myocardial infarction involving other coronary artery of inferior wall: Secondary | ICD-10-CM

## 2022-10-20 DIAGNOSIS — E785 Hyperlipidemia, unspecified: Secondary | ICD-10-CM | POA: Diagnosis not present

## 2022-10-20 DIAGNOSIS — I2510 Atherosclerotic heart disease of native coronary artery without angina pectoris: Secondary | ICD-10-CM

## 2022-10-20 DIAGNOSIS — Z9861 Coronary angioplasty status: Secondary | ICD-10-CM

## 2022-10-20 DIAGNOSIS — E1165 Type 2 diabetes mellitus with hyperglycemia: Secondary | ICD-10-CM | POA: Diagnosis not present

## 2022-10-20 MED ORDER — VASCEPA 1 G PO CAPS: 2.0000 g | ORAL_CAPSULE | Freq: Two times a day (BID) | ORAL | 3 refills | Status: DC

## 2022-10-20 MED ORDER — ATORVASTATIN CALCIUM 80 MG PO TABS: ORAL_TABLET | ORAL | 3 refills | Status: DC

## 2022-10-20 MED ORDER — CLOPIDOGREL BISULFATE 75 MG PO TABS: 75.0000 mg | ORAL_TABLET | Freq: Every day | ORAL | 3 refills | Status: DC

## 2022-10-20 MED ORDER — LOSARTAN POTASSIUM 50 MG PO TABS: 50.0000 mg | ORAL_TABLET | Freq: Every day | ORAL | 3 refills | Status: DC

## 2022-10-20 MED ORDER — CARVEDILOL 12.5 MG PO TABS: 12.5000 mg | ORAL_TABLET | Freq: Two times a day (BID) | ORAL | 3 refills | Status: DC

## 2022-10-20 MED ORDER — NITROGLYCERIN 0.4 MG SL SUBL: 0.4000 mg | SUBLINGUAL_TABLET | SUBLINGUAL | 3 refills | Status: DC | PRN

## 2022-10-20 NOTE — Addendum Note (Signed)
Addended by: Gean Birchwood on: 10/20/2022 02:06 PM   Modules accepted: Orders

## 2022-10-20 NOTE — Progress Notes (Signed)
Office Visit    Patient Name: David Bender Date of Encounter: 10/20/2022  Primary Care Provider:  Allwardt, Randa Evens, PA-C Primary Cardiologist:  Glenetta Hew, MD  Chief Complaint    47 year old with a history of CAD s/p STEMI, DES-m-dRCA, DES-M LAD in 2019/07/04, hypertension, hyperlipidemia, type 2 diabetes, former tobacco use, and obesity who presents for follow-up related to CAD.  Past Medical History    Past Medical History:  Diagnosis Date   Acute ST elevation myocardial infarction (STEMI) of inferior wall ([place]) 05/26/2019   dRCA occluded--DES PCI (distal PDA occluded with distalization.  Codominant circumflex patent.  With staged PCI of 90% mLAD; normal EF on echo   Coronary artery disease involving native coronary artery of native heart with unstable angina pectoris ([place]) 05/26/2019   a. 05/26/2019 PCI: 100% dRCA - DES PCI (Synergy DES 2.5 x 28 - 2.65 mm); pLAD focal 90%.  Co-dominant LCx, normal; b. 05/27/2019 Staged PCI: mLAD 90% (2.75x12 Synergy DES - 2.29m).   Hyperlipidemia associated with type 2 diabetes mellitus ([place]    Kidney stone    2023   Morbid obesity with BMI of 45.0-49.9, adult ([place]    Tobacco abuse - 2PPD 05/26/2019   Type II diabetes mellitus with complication (HCC)    Not currently taking medications   Past Surgical History:  Procedure Laterality Date   CORONARY STENT INTERVENTION N/A 05/27/2019   Procedure: CORONARY STENT INTERVENTION;  Surgeon: HLeonie Man MD;  [facility];  Service: Cardiovascular:: Staged PCI of mid LAD-Synergy DES 2.7 mm at 12 mm (2.9 mm).    CORONARY STENT INTERVENTION N/A 05/26/2019   Procedure: CORONARY STENT INTERVENTION;  Surgeon: HLeonie Man MD;  [facility];  Service: Cardiovascular:: distal RCA 100% (DES PCI Synergy 2.5 mm x 20 mm - 2.6 mm).   CORONARY/GRAFT ACUTE MI REVASCULARIZATION N/A 05/26/2019   Procedure: CORONARY/GRAFT ACUTE MI REVASCULARIZATION;  Surgeon: HLeonie Man  MD;  [facility];  Service: Cardiovascular:: PCI of dRCA   LEFT HEART CATH AND CORONARY ANGIOGRAPHY N/A 05/26/2019   Procedure: LEFT HEART CATH AND CORONARY ANGIOGRAPHY;  Surgeon: HLeonie Man MD;  [facility];  Service: Cardiovascular:: dRCA 100% (DES PCI) with dRPDA 100% from distal embolization, mLAD 90% (Staged DES PCI).    TRANSTHORACIC ECHOCARDIOGRAM  05/27/2019   EF 60 to 65%.  Unable to assess wall motion because of poor images.  GR 1 DD.  Both atria are normal size.  Normal valves.   WRIST SURGERY      Allergies  No Known Allergies   Labs/Other Studies Reviewed    The following studies were reviewed today: LHC 109-Nov-2020  CULPRIT LESION: Mid RCA to Dist RCA lesion is 100% stenosed. A drug-eluting stent was successfully placed using a STENT SYNERGY DES 2.5X28.--2.65 mm Post intervention, there is a 0% residual stenosis. RPDA lesion is 100% stenosed. Distalization ------------------------------------------- Mid LAD lesion is 90% stenosed. ------------------------------------------- The left ventricular systolic function is normal. The left ventricular ejection fraction is 50-55% by visual estimate. LV end diastolic pressure is moderately elevated.   SUMMARY Codominant system with Left Posterolateral branches and right PDA Severe two-vessel CAD: 100% Mid-distalRCA occluded (successful DES PCI with Synergy DES 2.5 mm x 28 mm--2.65 mm) and residual distal embolization to the PDA occluding the distal quarter of the PDA, 90% mid focal LAD lesion at D2 Preserved EF with distal inferoapical hypokinesis and moderate to severely elevated EDP  of 20 to 23 mmHg (Acute Diastolic Heart Failure).     RECOMMENDATIONS Admit to CCU for ongoing care and TR band removal. With significant LAD lesion, will restart aspirin 8 hours after sheath removal and plan for staged LAD PCI for 05/27/2019 Check fasting lipid panel and A1c Initiate high-dose/high intensity  statin (atorvastatin 80 mg) Start low-dose beta-blocker Consider echocardiogram prior to discharge Smoking cessation counseling   Echo 05/2019: IMPRESSIONS   1. Left ventricular ejection fraction, by visual estimation, is 60 to  65%. The left ventricle has normal function. There is no left ventricular  hypertrophy. Inadequate for regional WMA's.   2. Left ventricular diastolic Doppler parameters are consistent with  impaired relaxation pattern of LV diastolic filling.   3. Global right ventricle has normal systolic function.The right  ventricular size is normal. No increase in right ventricular wall  thickness.   4. Left atrial size was normal.   5. Right atrial size was normal.   6. The mitral valve is grossly normal. Trace mitral valve regurgitation.   7. The tricuspid valve is grossly normal. Tricuspid valve regurgitation  was not visualized by color flow Doppler.   8. The aortic valve is tricuspid Aortic valve regurgitation was not  visualized by color flow Doppler.   9. The pulmonic valve was grossly normal. Pulmonic valve regurgitation is  not visualized by color flow Doppler.   Coronary stent intervention 05/2019: Mid LAD lesion is 90% stenosed. Previously placed Mid RCA to Dist RCA drug eluting stent is widely patent. RPDA lesion is 100% stenosed -stable from 05/26/2019 Post intervention, there is a 0% residual stenosis. A drug-eluting stent was successfully placed using a STENT SYNERGY DES 2.75X12.   SUMMARY Successful DES PCI of mid LAD with synergy DES 2.75 mm a 12 mm (2.9 mm) Patent RCA stent from 05/26/2019, but persistent occlusion of the distal PDA (sidebranches are perfusing better)     RECOMMENDATIONS Okay to transfer to telemetry/progressive care--anticipate discharge tomorrow We will discontinue IV heparin. Continue aggressive risk factor modification Recent Labs: 12/31/2021: ALT 39; BUN 23; Creatinine, Ser 1.02; Hemoglobin 14.8; Platelets 269; Potassium 4.3;  Sodium 141  Recent Lipid Panel    Component Value Date/Time   CHOL 141 07/25/2021 0819   TRIG 427 (H) 07/25/2021 0819   HDL 34 (L) 07/25/2021 0819   CHOLHDL 4.1 07/25/2021 0819   CHOLHDL 5.9 05/27/2019 0220   VLDL 69 (H) 05/27/2019 0220   LDLCALC 44 07/25/2021 0819   LDLDIRECT 106.6 (H) 05/26/2019 0653    History of Present Illness    47 year old with the above past medical history including CAD s/p STEMI, DES-m-dRCA, DES-M LAD in 05/2019, hypertension, hyperlipidemia, type 2 diabetes, former tobacco use, and obesity.  He was hospitalized in 06/14/2019 in the setting of inferior STEMI.  Cardiac catheterization revealed 100% occluded mid to distal RCA s/p DES, RPDA 100% stenosis, mid LAD lesion 90% stenosis for which he underwent staged PCI/DES.  Echocardiogram at the time revealed EF 60 to 65%, no RWMA, normal RV systolic function, no significant valvular abnormalities.  He was last seen in the office on 01/03/2021 and was stable from a cardiac standpoint.  He did note some weight gain, though he did report having quit smoking.  He presents today for follow-up.  Since his last visit has been stable from a cardiac standpoint.  He denies symptoms concerning for angina.  BP is elevated in office today.  He notes that he worked all night.  He just took his morning  BP meds.  He does not check his BP at home.  He is in need of refills of his medications.  Overall, he reports feeling well.  Home Medications    Current Outpatient Medications  Medication Sig Dispense Refill   losartan (COZAAR) 50 MG tablet Take 1 tablet (50 mg total) by mouth daily. 90 tablet 3   metFORMIN (GLUCOPHAGE) 500 MG tablet TAKE 2 TABLETS BY MOUTH TWICE A DAY WITH A MEAL 240 tablet 0   atorvastatin (LIPITOR) 80 MG tablet TAKE 1 TABLET BY MOUTH DAILY **PLEASE KEEP APPOINTMENT FOR ADDITIONAL REFILLS** 90 tablet 3   carvedilol (COREG) 12.5 MG tablet Take 1 tablet (12.5 mg total) by mouth 2 (two) times daily. 180 tablet 3    clopidogrel (PLAVIX) 75 MG tablet Take 1 tablet (75 mg total) by mouth daily. 90 tablet 3   nitroGLYCERIN (NITROSTAT) 0.4 MG SL tablet Place 1 tablet (0.4 mg total) under the tongue every 5 (five) minutes as needed for chest pain (CP or SOB). 25 tablet 3   VASCEPA 1 g capsule Take 2 capsules (2 g total) by mouth 2 (two) times daily. 360 capsule 3   No current facility-administered medications for this visit.     Review of Systems    He denies chest pain, palpitations, dyspnea, pnd, orthopnea, n, v, dizziness, syncope, edema, weight gain, or early satiety. All other systems reviewed and are otherwise negative except as noted above.   Physical Exam    VS:  BP (!) 154/110   Pulse 82   Ht '6\' 2"'$  (1.88 m)   Wt (!) 380 lb (172.4 kg)   SpO2 92%   BMI 48.79 kg/m   GEN: Well nourished, well developed, in no acute distress. HEENT: normal. Neck: Supple, no JVD, carotid bruits, or masses. Cardiac: RRR, no murmurs, rubs, or gallops. No clubbing, cyanosis, edema.  Radials/DP/PT 2+ and equal bilaterally.  Respiratory:  Respirations regular and unlabored, clear to auscultation bilaterally. GI: Soft, nontender, nondistended, BS + x 4. MS: no deformity or atrophy. Skin: warm and dry, no rash. Neuro:  Strength and sensation are intact. Psych: Normal affect.  Accessory Clinical Findings    ECG personally reviewed by me today -NSR, 82 bpm- no acute changes.   Lab Results  Component Value Date   WBC 12.3 (H) 12/31/2021   HGB 14.8 12/31/2021   HCT 42.6 12/31/2021   MCV 88.2 12/31/2021   PLT 269 12/31/2021   Lab Results  Component Value Date   CREATININE 1.02 12/31/2021   BUN 23 (H) 12/31/2021   NA 141 12/31/2021   K 4.3 12/31/2021   CL 103 12/31/2021   CO2 25 12/31/2021   Lab Results  Component Value Date   ALT 39 12/31/2021   AST 26 12/31/2021   ALKPHOS 53 12/31/2021   BILITOT 0.7 12/31/2021   Lab Results  Component Value Date   CHOL 141 07/25/2021   HDL 34 (L) 07/25/2021    LDLCALC 44 07/25/2021   LDLDIRECT 106.6 (H) 05/26/2019   TRIG 427 (H) 07/25/2021   CHOLHDL 4.1 07/25/2021    Lab Results  Component Value Date   HGBA1C 6.4 (A) 02/04/2022    Assessment & Plan    1. CAD: S/p STEMI, DES-m-dRCA, DES-M LAD in 05/2019. Stable with no anginal symptoms. No indication for ischemic evaluation.  Continue Plavix, carvedilol, losartan, Lipitor, Vascepa.  2. Hypertension BP elevated above goal in office today.  He just took his morning BP meds, he notes that he was  up working all night.  He does not check BP at home.  Will increase losartan to 50 mg daily.  I advised him to purchase a BP cuff for home monitoring.  BP log provided in office today.  Continue to monitor BP and report BP consistently greater than 130/80.  Continue carvedilol.  Will plan for CMET at next follow-up visit.  3. Hyperlipidemia: LDL was 44 in 07/2021.  He is not fasting today.  Will plan for repeat fasting lipids, CMET at next follow-up visit.  Continue Lipitor.  4. Type 2 diabetes: A1c was 6.4 in 01/2022.  Monitored and managed per PCP.  5. Obesity: Encouraged ongoing lifestyle modifications with diet and exercise.    6. Disposition: Follow-up in 1 month.  HYPERTENSION CONTROL Vitals:   10/20/22 0843 10/20/22 0912  BP: (!) 150/108 (!) 154/110    The patient's blood pressure is elevated above target today.  In order to address the patient's elevated BP: A current anti-hypertensive medication was adjusted today.; Blood pressure will be monitored at home to determine if medication changes need to be made.; Follow up with general cardiology has been recommended.      Lenna Sciara, NP 10/20/2022, 9:28 AM

## 2022-10-20 NOTE — Patient Instructions (Addendum)
Medication Instructions:  Increase Losartan 50 mg daily   *If you need a refill on your cardiac medications before your next appointment, please call your pharmacy*   Lab Work: NONE ordered at this time of appointment   If you have labs (blood work) drawn today and your tests are completely normal, you will receive your results only by: [place] (if you have MyChart) OR A paper copy in the mail If you have any lab test that is abnormal or we need to change your treatment, we will call you to review the results.   Testing/Procedures: NONE ordered at this time of appointment     Follow-Up: At [facility], you and your health needs are our priority.  As part of our continuing mission to provide you with exceptional heart care, we have created designated Provider Care Teams.  These Care Teams include your primary Cardiologist (physician) and Advanced Practice Providers (APPs -  Physician Assistants and Nurse Practitioners) who all work together to provide you with the care you need, when you need it.  We recommend signing up for the patient portal called "MyChart".  Sign up information is provided on this After Visit Summary.  MyChart is used to connect with patients for Virtual Visits (Telemedicine).  Patients are able to view lab/test results, encounter notes, upcoming appointments, etc.  Non-urgent messages can be sent to your provider as well.   To learn more about what you can do with MyChart, go to [email or web address].    Your next appointment:   1 month(s)  Provider:   Diona Browner, NP        Other Instructions [employer] Cuff recommended. Please check and record BP once daily.

## 2022-11-03 ENCOUNTER — Ambulatory Visit (INDEPENDENT_AMBULATORY_CARE_PROVIDER_SITE_OTHER): Payer: No Typology Code available for payment source | Admitting: Family Medicine

## 2022-11-03 ENCOUNTER — Encounter: Payer: Self-pay | Admitting: Family Medicine

## 2022-11-03 VITALS — BP 130/90 | HR 83 | Temp 98.3°F | Ht 74.0 in | Wt 378.5 lb

## 2022-11-03 DIAGNOSIS — L989 Disorder of the skin and subcutaneous tissue, unspecified: Secondary | ICD-10-CM | POA: Diagnosis not present

## 2022-11-03 MED ORDER — DOXYCYCLINE HYCLATE 100 MG PO TABS: 100.0000 mg | ORAL_TABLET | Freq: Two times a day (BID) | ORAL | 0 refills | Status: DC

## 2022-11-03 MED ORDER — MUPIROCIN 2 % EX OINT: 1.0000 | TOPICAL_OINTMENT | Freq: Two times a day (BID) | CUTANEOUS | 0 refills | Status: DC

## 2022-11-03 NOTE — Patient Instructions (Signed)
Mupirocin cream 2x/day for 5-7 days Doxycycline finish the pills Stop the trimetoprim/sulfa  Warm soaks twice daily

## 2022-11-03 NOTE — Progress Notes (Signed)
Subjective:     Patient ID: David Bender, male    DOB: September 25, 1975, 47 y.o.   MRN: [id number]  Chief Complaint  Patient presents with   Insect Bite    Spider bite on left arm that happened last Tuesday, redness, pain and swelling, went to UC, currently taking antibiotics     HPI  1 wk, bump on forearm and next day bigger and pain-went to med ctr in [place] and placed on bactrim.  Yesterday-worse.  Very sensitive at times.  No d/c.  No f/c.  Sugar last wk 112.   Health Maintenance Due  Topic Date Due   OPHTHALMOLOGY EXAM  Never done   Diabetic kidney evaluation - Urine ACR  Never done   HEMOGLOBIN A1C  08/06/2022    Past Medical History:  Diagnosis Date   Acute ST elevation myocardial infarction (STEMI) of inferior wall ([place]) 05/26/2019   dRCA occluded--DES PCI (distal PDA occluded with distalization.  Codominant circumflex patent.  With staged PCI of 90% mLAD; normal EF on echo   Coronary artery disease involving native coronary artery of native heart with unstable angina pectoris ([place]) 05/26/2019   a. 05/26/2019 PCI: 100% dRCA - DES PCI (Synergy DES 2.5 x 28 - 2.65 mm); pLAD focal 90%.  Co-dominant LCx, normal; b. 05/27/2019 Staged PCI: mLAD 90% (2.75x12 Synergy DES - 2.30m).   Hyperlipidemia associated with type 2 diabetes mellitus [place] [place] [facility]    Kidney stone    2023   Morbid obesity with BMI of 45.0-49.9, adult [place]    Tobacco abuse - 2PPD 05/26/2019   Type II diabetes mellitus with complication (HCC)    Not currently taking medications    Past Surgical History:  Procedure Laterality Date   CORONARY STENT INTERVENTION N/A 05/27/2019   Procedure: CORONARY STENT INTERVENTION;  Surgeon: HLeonie Man MD;  [facility];  Service: Cardiovascular:: Staged PCI of mid LAD-Synergy DES 2.7 mm at 12 mm (2.9 mm).    CORONARY STENT INTERVENTION N/A 05/26/2019   Procedure: CORONARY STENT INTERVENTION;  Surgeon: HLeonie Man MD;  [facility];   Service: Cardiovascular:: distal RCA 100% (DES PCI Synergy 2.5 mm x 20 mm - 2.6 mm).   CORONARY/GRAFT ACUTE MI REVASCULARIZATION N/A 05/26/2019   Procedure: CORONARY/GRAFT ACUTE MI REVASCULARIZATION;  Surgeon: HLeonie Man MD;  [facility];  Service: Cardiovascular:: PCI of dRCA   LEFT HEART CATH AND CORONARY ANGIOGRAPHY N/A 05/26/2019   Procedure: LEFT HEART CATH AND CORONARY ANGIOGRAPHY;  Surgeon: HLeonie Man MD;  [facility];  Service: Cardiovascular:: dRCA 100% (DES PCI) with dRPDA 100% from distal embolization, mLAD 90% (Staged DES PCI).    TRANSTHORACIC ECHOCARDIOGRAM  05/27/2019   EF 60 to 65%.  Unable to assess wall motion because of poor images.  GR 1 DD.  Both atria are normal size.  Normal valves.   WRIST SURGERY      Outpatient Medications Prior to Visit  Medication Sig Dispense Refill   atorvastatin (LIPITOR) 80 MG tablet TAKE 1 TABLET BY MOUTH DAILY **PLEASE KEEP APPOINTMENT FOR ADDITIONAL REFILLS** 90 tablet 3   carvedilol (COREG) 12.5 MG tablet Take 1 tablet (12.5 mg total) by mouth 2 (two) times daily. 180 tablet 3   clopidogrel (PLAVIX) 75 MG tablet Take 1 tablet (75 mg total) by mouth daily. 90 tablet 3   losartan (COZAAR) 50 MG tablet Take 1 tablet (50 mg total) by mouth daily. 90 tablet  3   metFORMIN (GLUCOPHAGE) 500 MG tablet TAKE 2 TABLETS BY MOUTH TWICE A DAY WITH A MEAL 240 tablet 0   nitroGLYCERIN (NITROSTAT) 0.4 MG SL tablet Place 1 tablet (0.4 mg total) under the tongue every 5 (five) minutes as needed for chest pain (CP or SOB). 25 tablet 3   sulfamethoxazole-trimethoprim (BACTRIM DS) 800-160 MG tablet Take 1 tablet by mouth 2 (two) times daily.     VASCEPA 1 g capsule Take 2 capsules (2 g total) by mouth 2 (two) times daily. 360 capsule 3   No facility-administered medications prior to visit.    No Known Allergies ROS neg/noncontributory except as noted HPI/below      Objective:     BP (!) 130/90   Pulse 83    Temp 98.3 F (36.8 C) (Temporal)   Ht '6\' 2"'$  (1.88 m)   Wt (!) 378 lb 8 oz (171.7 kg)   SpO2 96%   BMI 48.60 kg/m  Wt Readings from Last 3 Encounters:  11/03/22 (!) 378 lb 8 oz (171.7 kg)  10/20/22 (!) 380 lb (172.4 kg)  02/04/22 (!) 364 lb 12.8 oz (165.5 kg)    Physical Exam   Gen: WDWN NAD MSK: no gross abnormalities.  NEURO: A&O x3.  CN II-XII intact.  PSYCH: normal mood. Good eye contact L forearm-approx 2-3cm, indurated(hard), red nodule w/few vessicle appearings.  Used needle-no fluid     Assessment & Plan:   Problem List Items Addressed This Visit   None Visit Diagnoses     Skin lesion    -  Primary      Skin lesion-?organized abscess, MRSA,HSV, spider bite, other.   Will change abx to doxy 100bid x 10d.  Mupirocin bid.  Soaks.  Worse, no change, f/u.  Nothing to culture.  Too late to tx w/antivirals if HSV.   F/u Alyssa other chronic conditions.    Meds ordered this encounter  Medications   doxycycline (VIBRA-TABS) 100 MG tablet    Sig: Take 1 tablet (100 mg total) by mouth 2 (two) times daily.    Dispense:  20 tablet    Refill:  0   mupirocin ointment (BACTROBAN) 2 %    Sig: Apply 1 Application topically 2 (two) times daily.    Dispense:  22 g    Refill:  0    Wellington Hampshire, MD

## 2022-11-25 ENCOUNTER — Ambulatory Visit: Payer: No Typology Code available for payment source | Attending: Nurse Practitioner | Admitting: Nurse Practitioner

## 2022-11-25 ENCOUNTER — Encounter: Payer: Self-pay | Admitting: Nurse Practitioner

## 2022-11-25 ENCOUNTER — Other Ambulatory Visit: Payer: Self-pay | Admitting: Physician Assistant

## 2022-11-25 VITALS — BP 150/100 | HR 81 | Ht 74.0 in | Wt 382.0 lb

## 2022-11-25 DIAGNOSIS — E1169 Type 2 diabetes mellitus with other specified complication: Secondary | ICD-10-CM

## 2022-11-25 DIAGNOSIS — I10 Essential (primary) hypertension: Secondary | ICD-10-CM | POA: Diagnosis not present

## 2022-11-25 DIAGNOSIS — I2510 Atherosclerotic heart disease of native coronary artery without angina pectoris: Secondary | ICD-10-CM

## 2022-11-25 DIAGNOSIS — E1165 Type 2 diabetes mellitus with hyperglycemia: Secondary | ICD-10-CM | POA: Diagnosis not present

## 2022-11-25 DIAGNOSIS — E785 Hyperlipidemia, unspecified: Secondary | ICD-10-CM | POA: Diagnosis not present

## 2022-11-25 MED ORDER — OLMESARTAN MEDOXOMIL 40 MG PO TABS: 40.0000 mg | ORAL_TABLET | Freq: Every day | ORAL | 3 refills | Status: DC

## 2022-11-25 NOTE — Progress Notes (Signed)
Office Visit    Patient Name: David Bender Date of Encounter: 11/25/2022  Primary Care Provider:  Allwardt, Randa Evens, PA-C Primary Cardiologist:  Glenetta Hew, MD  Chief Complaint    47 year old with a history of CAD s/p STEMI, DES-m-dRCA, DES-M LAD in Jul 03, 2019, hypertension, hyperlipidemia, type 2 diabetes, former tobacco use, and obesity who presents for follow-up related to CAD.  Past Medical History    Past Medical History:  Diagnosis Date   Acute ST elevation myocardial infarction (STEMI) of inferior wall 05/26/2019   dRCA occluded--DES PCI (distal PDA occluded with distalization.  Codominant circumflex patent.  With staged PCI of 90% mLAD; normal EF on echo   Coronary artery disease involving native coronary artery of native heart with unstable angina pectoris 05/26/2019   a. 05/26/2019 PCI: 100% dRCA - DES PCI (Synergy DES 2.5 x 28 - 2.65 mm); pLAD focal 90%.  Co-dominant LCx, normal; b. 05/27/2019 Staged PCI: mLAD 90% (2.75x12 Synergy DES - 2.34mm).   Hyperlipidemia associated with type 2 diabetes mellitus    Kidney stone    2023   Morbid obesity with BMI of 45.0-49.9, adult    Tobacco abuse - 2PPD 05/26/2019   Type II diabetes mellitus with complication    Not currently taking medications   Past Surgical History:  Procedure Laterality Date   CORONARY STENT INTERVENTION N/A 05/27/2019   Procedure: CORONARY STENT INTERVENTION;  Surgeon: Leonie Man, MD;  Location: [facility];  Service: Cardiovascular:: Staged PCI of mid LAD-Synergy DES 2.7 mm at 12 mm (2.9 mm).    CORONARY STENT INTERVENTION N/A 05/26/2019   Procedure: CORONARY STENT INTERVENTION;  Surgeon: Leonie Man, MD;  Location: [facility];  Service: Cardiovascular:: distal RCA 100% (DES PCI Synergy 2.5 mm x 20 mm - 2.6 mm).   CORONARY/GRAFT ACUTE MI REVASCULARIZATION N/A 05/26/2019   Procedure: CORONARY/GRAFT ACUTE MI REVASCULARIZATION;  Surgeon: Leonie Man, MD;  Location: [facility];  Service: Cardiovascular:: PCI of dRCA   LEFT HEART CATH AND CORONARY ANGIOGRAPHY N/A 05/26/2019   Procedure: LEFT HEART CATH AND CORONARY ANGIOGRAPHY;  Surgeon: Leonie Man, MD;  Location: [facility];  Service: Cardiovascular:: dRCA 100% (DES PCI) with dRPDA 100% from distal embolization, mLAD 90% (Staged DES PCI).    TRANSTHORACIC ECHOCARDIOGRAM  05/27/2019   EF 60 to 65%.  Unable to assess wall motion because of poor images.  GR 1 DD.  Both atria are normal size.  Normal valves.   WRIST SURGERY      Allergies  No Known Allergies   Labs/Other Studies Reviewed    The following studies were reviewed today: [facility] 03-Jul-2019:  CULPRIT LESION: Mid RCA to Dist RCA lesion is 100% stenosed. A drug-eluting stent was successfully placed using a STENT SYNERGY DES 2.5X28.--2.65 mm Post intervention, there is a 0% residual stenosis. RPDA lesion is 100% stenosed. Distalization ------------------------------------------- Mid LAD lesion is 90% stenosed. ------------------------------------------- The left ventricular systolic function is normal. The left ventricular ejection fraction is 50-55% by visual estimate. LV end diastolic pressure is moderately elevated.   SUMMARY Codominant system with Left Posterolateral branches and right PDA Severe two-vessel CAD: 100% Mid-distalRCA occluded (successful DES PCI with Synergy DES 2.5 mm x 28 mm--2.65 mm) and residual distal embolization to the PDA occluding the distal quarter of the PDA, 90% mid focal LAD lesion at D2 Preserved EF with distal inferoapical hypokinesis and moderate to severely elevated EDP of 20 to 23 mmHg (  Acute Diastolic Heart Failure).     RECOMMENDATIONS Admit to CCU for ongoing care and TR band removal. With significant LAD lesion, will restart aspirin 8 hours after sheath removal and plan for staged LAD PCI for 05/27/2019 Check fasting lipid panel and A1c Initiate high-dose/high intensity statin (atorvastatin 80  mg) Start low-dose beta-blocker Consider echocardiogram prior to discharge Smoking cessation counseling   Echo 05/2019: IMPRESSIONS   1. Left ventricular ejection fraction, by visual estimation, is 60 to  65%. The left ventricle has normal function. There is no left ventricular  hypertrophy. Inadequate for regional WMA's.   2. Left ventricular diastolic Doppler parameters are consistent with  impaired relaxation pattern of LV diastolic filling.   3. Global right ventricle has normal systolic function.The right  ventricular size is normal. No increase in right ventricular wall  thickness.   4. Left atrial size was normal.   5. Right atrial size was normal.   6. The mitral valve is grossly normal. Trace mitral valve regurgitation.   7. The tricuspid valve is grossly normal. Tricuspid valve regurgitation  was not visualized by color flow Doppler.   8. The aortic valve is tricuspid Aortic valve regurgitation was not  visualized by color flow Doppler.   9. The pulmonic valve was grossly normal. Pulmonic valve regurgitation is  not visualized by color flow Doppler.    Coronary stent intervention 05/2019: Mid LAD lesion is 90% stenosed. Previously placed Mid RCA to Dist RCA drug eluting stent is widely patent. RPDA lesion is 100% stenosed -stable from 05/26/2019 Post intervention, there is a 0% residual stenosis. A drug-eluting stent was successfully placed using a STENT SYNERGY DES 2.75X12.   SUMMARY Successful DES PCI of mid LAD with synergy DES 2.75 mm a 12 mm (2.9 mm) Patent RCA stent from 05/26/2019, but persistent occlusion of the distal PDA (sidebranches are perfusing better)     RECOMMENDATIONS Okay to transfer to telemetry/progressive care--anticipate discharge tomorrow We will discontinue IV heparin. Continue aggressive risk factor modification  Recent Labs: 12/31/2021: ALT 39; BUN 23; Creatinine, Ser 1.02; Hemoglobin 14.8; Platelets 269; Potassium 4.3; Sodium 141  Recent  Lipid Panel    Component Value Date/Time   CHOL 141 07/25/2021 0819   TRIG 427 (H) 07/25/2021 0819   HDL 34 (L) 07/25/2021 0819   CHOLHDL 4.1 07/25/2021 0819   CHOLHDL 5.9 05/27/2019 0220   VLDL 69 (H) 05/27/2019 0220   LDLCALC 44 07/25/2021 0819   LDLDIRECT 106.6 (H) 05/26/2019 0653    History of Present Illness    47 year old with the above past medical history including CAD s/p STEMI, DES-m-dRCA, DES-M LAD in 05/2019, hypertension, hyperlipidemia, type 2 diabetes, former tobacco use, and obesity.   He was hospitalized in 06/14/2019 in the setting of inferior STEMI.  Cardiac catheterization revealed 100% occluded mid to distal RCA s/p DES, RPDA 100% stenosis, mid LAD lesion 90% stenosis for which he underwent staged PCI/DES.  Echocardiogram at the time revealed EF 60 to 65%, no RWMA, normal RV systolic function, no significant valvular abnormalities.  He was last seen in the office on 10/20/2022 and was stable from a cardiac standpoint.  He denied symptoms concerning for angina.  His BP was elevated.  Losartan was increased to 50 mg daily.   He presents today for follow-up.  Since his last visit he has been stable from a cardiac standpoint.  He denies any symptoms concerning for angina.  BP remains elevated above goal.  Overall, he reports feeling well.  Home  Medications    Current Outpatient Medications  Medication Sig Dispense Refill   atorvastatin (LIPITOR) 80 MG tablet TAKE 1 TABLET BY MOUTH DAILY **PLEASE KEEP APPOINTMENT FOR ADDITIONAL REFILLS** 90 tablet 3   carvedilol (COREG) 12.5 MG tablet Take 1 tablet (12.5 mg total) by mouth 2 (two) times daily. 180 tablet 3   clopidogrel (PLAVIX) 75 MG tablet Take 1 tablet (75 mg total) by mouth daily. 90 tablet 3   doxycycline (VIBRA-TABS) 100 MG tablet Take 1 tablet (100 mg total) by mouth 2 (two) times daily. 20 tablet 0   metFORMIN (GLUCOPHAGE) 500 MG tablet TAKE 2 TABLETS BY MOUTH TWICE A DAY WITH A MEAL 240 tablet 0   mupirocin  ointment (BACTROBAN) 2 % Apply 1 Application topically 2 (two) times daily. 22 g 0   nitroGLYCERIN (NITROSTAT) 0.4 MG SL tablet Place 1 tablet (0.4 mg total) under the tongue every 5 (five) minutes as needed for chest pain (CP or SOB). 25 tablet 3   olmesartan (BENICAR) 40 MG tablet Take 1 tablet (40 mg total) by mouth daily. 90 tablet 3   sulfamethoxazole-trimethoprim (BACTRIM DS) 800-160 MG tablet Take 1 tablet by mouth 2 (two) times daily.     VASCEPA 1 g capsule Take 2 capsules (2 g total) by mouth 2 (two) times daily. 360 capsule 3   No current facility-administered medications for this visit.     Review of Systems    He denies chest pain, palpitations, dyspnea, pnd, orthopnea, n, v, dizziness, syncope, edema, weight gain, or early satiety. All other systems reviewed and are otherwise negative except as noted above.   Physical Exam    VS:  BP (!) 150/100   Pulse 81   Ht 6\' 2"  (1.88 m)   Wt (!) 382 lb (173.3 kg)   SpO2 96%   BMI 49.05 kg/m  GEN: Well nourished, well developed, in no acute distress. HEENT: normal. Neck: Supple, no JVD, carotid bruits, or masses. Cardiac: RRR, no murmurs, rubs, or gallops. No clubbing, cyanosis, edema.  Radials/DP/PT 2+ and equal bilaterally.  Respiratory:  Respirations regular and unlabored, clear to auscultation bilaterally. GI: Soft, nontender, nondistended, BS + x 4. MS: no deformity or atrophy. Skin: warm and dry, no rash. Neuro:  Strength and sensation are intact. Psych: Normal affect.  Accessory Clinical Findings    ECG personally reviewed by me today -no EKG in office today.   Lab Results  Component Value Date   WBC 12.3 (H) 12/31/2021   HGB 14.8 12/31/2021   HCT 42.6 12/31/2021   MCV 88.2 12/31/2021   PLT 269 12/31/2021   Lab Results  Component Value Date   CREATININE 1.02 12/31/2021   BUN 23 (H) 12/31/2021   NA 141 12/31/2021   K 4.3 12/31/2021   CL 103 12/31/2021   CO2 25 12/31/2021   Lab Results  Component Value  Date   ALT 39 12/31/2021   AST 26 12/31/2021   ALKPHOS 53 12/31/2021   BILITOT 0.7 12/31/2021   Lab Results  Component Value Date   CHOL 141 07/25/2021   HDL 34 (L) 07/25/2021   LDLCALC 44 07/25/2021   LDLDIRECT 106.6 (H) 05/26/2019   TRIG 427 (H) 07/25/2021   CHOLHDL 4.1 07/25/2021    Lab Results  Component Value Date   HGBA1C 6.4 (A) 02/04/2022    Assessment & Plan   1. Hypertension BP remains elevated above goal despite increased losartan. Will stop losartan and start olmesartan 40 mg daily. Continue to monitor  BP and report BP consistently greater than 130/80.  Continue carvedilol.  Will update CMET today.    2. CAD: S/p STEMI, DES-m-dRCA, DES-M LAD in 05/2019. Stable with no anginal symptoms. No indication for ischemic evaluation.  Continue Plavix, carvedilol, losartan, Lipitor, Vascepa.  3. Hyperlipidemia: LDL was 44 in 07/2021.  Will repeat fasting lipids, CMET today.  Continue Lipitor.   4. Type 2 diabetes: A1c was 6.4 in 01/2022.  Monitored and managed per PCP.   5. Obesity: Encouraged ongoing lifestyle modifications with diet and exercise.     6. Disposition: Follow-up in 3 months.   HYPERTENSION CONTROL Vitals:   11/25/22 0811 11/25/22 0853  BP: (!) 152/96 (!) 150/100    The patient's blood pressure is elevated above target today.  In order to address the patient's elevated BP: Blood pressure will be monitored at home to determine if medication changes need to be made.; A new medication was prescribed today.; Follow up with general cardiology has been recommended.        Lenna Sciara, NP 11/25/2022, 12:40 PM

## 2022-11-25 NOTE — Patient Instructions (Addendum)
Medication Instructions:  Stop Taking Losartan Start Taking Olmesartan 40 MG by mouth daily  *If you need a refill on your cardiac medications before your next appointment, please call your pharmacy*   Lab Work: Your physician recommends that you have for lab work today:  Lipids CMET   If you have labs (blood work) drawn today and your tests are completely normal, you will receive your results only by: [place] (if you have MyChart) OR A paper copy in the mail If you have any lab test that is abnormal or we need to change your treatment, we will call you to review the results.   Testing/Procedures: None ordered    Follow-Up: At [facility], you and your health needs are our priority.  As part of our continuing mission to provide you with exceptional heart care, we have created designated Provider Care Teams.  These Care Teams include your primary Cardiologist (physician) and Advanced Practice Providers (APPs -  Physician Assistants and Nurse Practitioners) who all work together to provide you with the care you need, when you need it.  We recommend signing up for the patient portal called "MyChart".  Sign up information is provided on this After Visit Summary.  MyChart is used to connect with patients for Virtual Visits (Telemedicine).  Patients are able to view lab/test results, encounter notes, upcoming appointments, etc.  Non-urgent messages can be sent to your provider as well.   To learn more about what you can do with MyChart, go to [email or web address].    Your next appointment:   3 month(s)  Provider:   Diona Browner, NP       Other Instructions Please take and record your blood pressure daily for 2 weeks. Please include heart rates.   HOW TO TAKE YOUR BLOOD PRESSURE: Rest 5 minutes before taking your blood pressure. Don't smoke or drink caffeinated beverages for at least 30 minutes before. Take your blood pressure before (not after) you eat. Sit  comfortably with your back supported and both feet on the floor (don't cross your legs). Elevate your arm to heart level on a table or a desk. Use the proper sized cuff. It should fit smoothly and snugly around your bare upper arm. There should be enough room to slip a fingertip under the cuff. The bottom edge of the cuff should be 1 inch above the crease of the elbow. Ideally, take 3 measurements at one sitting and record the average.

## 2022-11-26 LAB — COMPREHENSIVE METABOLIC PANEL
ALT: 50 IU/L — ABNORMAL HIGH (ref 0–44)
AST: 28 IU/L (ref 0–40)
Albumin/Globulin Ratio: 2.1 (ref 1.2–2.2)
Albumin: 4.5 g/dL (ref 4.1–5.1)
Alkaline Phosphatase: 55 IU/L (ref 44–121)
BUN/Creatinine Ratio: 24 — ABNORMAL HIGH (ref 9–20)
BUN: 13 mg/dL (ref 6–24)
Bilirubin Total: 0.3 mg/dL (ref 0.0–1.2)
CO2: 27 mmol/L (ref 20–29)
Calcium: 9.7 mg/dL (ref 8.7–10.2)
Chloride: 102 mmol/L (ref 96–106)
Creatinine, Ser: 0.54 mg/dL — ABNORMAL LOW (ref 0.76–1.27)
Globulin, Total: 2.1 g/dL (ref 1.5–4.5)
Glucose: 141 mg/dL — ABNORMAL HIGH (ref 70–99)
Potassium: 4.9 mmol/L (ref 3.5–5.2)
Sodium: 145 mmol/L — ABNORMAL HIGH (ref 134–144)
Total Protein: 6.6 g/dL (ref 6.0–8.5)
eGFR: 126 mL/min/{1.73_m2} (ref >59)

## 2022-11-26 LAB — LIPID PANEL
Chol/HDL Ratio: 4 ratio (ref 0.0–5.0)
Cholesterol, Total: 159 mg/dL (ref 100–199)
HDL: 40 mg/dL (ref >39)
LDL Chol Calc (NIH): 50 mg/dL (ref 0–99)
Triglycerides: 467 mg/dL — ABNORMAL HIGH (ref 0–149)
VLDL Cholesterol Cal: 69 mg/dL — ABNORMAL HIGH (ref 5–40)

## 2022-11-27 ENCOUNTER — Telehealth: Payer: Self-pay

## 2022-11-27 NOTE — Telephone Encounter (Signed)
Lmom to discuss lab results. Waiting on a return call.  

## 2022-12-12 ENCOUNTER — Telehealth: Payer: Self-pay

## 2022-12-12 NOTE — Telephone Encounter (Signed)
Lmom to discuss lab results. Waiting on a return call.  

## 2023-02-16 ENCOUNTER — Telehealth: Payer: Self-pay | Admitting: Physician Assistant

## 2023-02-16 ENCOUNTER — Other Ambulatory Visit: Payer: Self-pay

## 2023-02-16 DIAGNOSIS — E1169 Type 2 diabetes mellitus with other specified complication: Secondary | ICD-10-CM

## 2023-02-16 MED ORDER — METFORMIN HCL 500 MG PO TABS: ORAL_TABLET | ORAL | 0 refills | Status: DC

## 2023-02-16 NOTE — Telephone Encounter (Signed)
Called and spoke with patient to advise him that a 14 day supply was sent to the pharmacy , and he must keep is appt.

## 2023-02-16 NOTE — Telephone Encounter (Signed)
Is okay  send just enough pills until his appt.

## 2023-02-16 NOTE — Telephone Encounter (Signed)
Patient scheduled for Medication follow up on 7/3 since OV needed to keep Metformin filled. Pt only has enough medication to last until 6/28. Is it okay to miss some dosages or should a couple of pills be sent in? Please Advise.

## 2023-02-24 ENCOUNTER — Encounter: Payer: Self-pay | Admitting: Nurse Practitioner

## 2023-02-24 ENCOUNTER — Ambulatory Visit: Payer: No Typology Code available for payment source | Attending: Nurse Practitioner | Admitting: Nurse Practitioner

## 2023-02-24 VITALS — BP 132/102 | HR 78 | Ht 74.0 in | Wt 389.2 lb

## 2023-02-24 DIAGNOSIS — E1165 Type 2 diabetes mellitus with hyperglycemia: Secondary | ICD-10-CM | POA: Diagnosis not present

## 2023-02-24 DIAGNOSIS — I2510 Atherosclerotic heart disease of native coronary artery without angina pectoris: Secondary | ICD-10-CM

## 2023-02-24 DIAGNOSIS — Z7984 Long term (current) use of oral hypoglycemic drugs: Secondary | ICD-10-CM

## 2023-02-24 DIAGNOSIS — I10 Essential (primary) hypertension: Secondary | ICD-10-CM

## 2023-02-24 DIAGNOSIS — E785 Hyperlipidemia, unspecified: Secondary | ICD-10-CM

## 2023-02-24 MED ORDER — AMLODIPINE BESYLATE 5 MG PO TABS: 5.0000 mg | ORAL_TABLET | Freq: Every day | ORAL | 3 refills | Status: DC

## 2023-02-24 NOTE — Patient Instructions (Addendum)
Medication Instructions:  Start Amlodipine 5 mg daily.  *If you need a refill on your cardiac medications before your next appointment, please call your pharmacy*   Lab Work: NONE ordered at this time of appointment     Testing/Procedures: NONE ordered at this time of appointment     Follow-Up: At [facility], you and your health needs are our priority.  As part of our continuing mission to provide you with exceptional heart care, we have created designated Provider Care Teams.  These Care Teams include your primary Cardiologist (physician) and Advanced Practice Providers (APPs -  Physician Assistants and Nurse Practitioners) who all work together to provide you with the care you need, when you need it.  We recommend signing up for the patient portal called "MyChart".  Sign up information is provided on this After Visit Summary.  MyChart is used to connect with patients for Virtual Visits (Telemedicine).  Patients are able to view lab/test results, encounter notes, upcoming appointments, etc.  Non-urgent messages can be sent to your provider as well.   To learn more about what you can do with MyChart, go to [email or web address].    Your next appointment:   2 month(s)  Provider:   Bernadene Person, NP

## 2023-02-24 NOTE — Progress Notes (Signed)
Office Visit    Patient Name: David Bender Date of Encounter: 02/24/2023  Primary Care Provider:  Allwardt, Crist Infante, PA-C Primary Cardiologist:  Bryan Lemma, MD  Chief Complaint    47 year old with a history of CAD s/p STEMI, DES-m-dRCA, DES-M LAD in 05/2019, hypertension, hyperlipidemia, type 2 diabetes, former tobacco use, and obesity who presents for follow-up related to CAD and hypertension.   Past Medical History    Past Medical History:  Diagnosis Date   Acute ST elevation myocardial infarction (STEMI) of inferior wall (HCC) 05/26/2019   dRCA occluded--DES PCI (distal PDA occluded with distalization.  Codominant circumflex patent.  With staged PCI of 90% mLAD; normal EF on echo   Coronary artery disease involving native coronary artery of native heart with unstable angina pectoris (HCC) 05/26/2019   a. 05/26/2019 PCI: 100% dRCA - DES PCI (Synergy DES 2.5 x 28 - 2.65 mm); pLAD focal 90%.  Co-dominant LCx, normal; b. 05/27/2019 Staged PCI: mLAD 90% (2.75x12 Synergy DES - 2.10mm).   Hyperlipidemia associated with type 2 diabetes mellitus (HCC)    Kidney stone    2023   Morbid obesity with BMI of 45.0-49.9, adult (HCC)    Tobacco abuse - 2PPD 05/26/2019   Type II diabetes mellitus with complication (HCC)    Not currently taking medications   Past Surgical History:  Procedure Laterality Date   CORONARY STENT INTERVENTION N/A 05/27/2019   Procedure: CORONARY STENT INTERVENTION;  Surgeon: Marykay Lex, MD;  Location: [facility] INVASIVE CV LAB;  Service: Cardiovascular:: Staged PCI of mid LAD-Synergy DES 2.7 mm at 12 mm (2.9 mm).    CORONARY STENT INTERVENTION N/A 05/26/2019   Procedure: CORONARY STENT INTERVENTION;  Surgeon: Marykay Lex, MD;  Location: [facility] INVASIVE CV LAB;  Service: Cardiovascular:: distal RCA 100% (DES PCI Synergy 2.5 mm x 20 mm - 2.6 mm).   CORONARY/GRAFT ACUTE MI REVASCULARIZATION N/A 05/26/2019   Procedure: CORONARY/GRAFT ACUTE MI REVASCULARIZATION;  Surgeon:  Marykay Lex, MD;  Location: [facility] INVASIVE CV LAB;  Service: Cardiovascular:: PCI of dRCA   LEFT HEART CATH AND CORONARY ANGIOGRAPHY N/A 05/26/2019   Procedure: LEFT HEART CATH AND CORONARY ANGIOGRAPHY;  Surgeon: Marykay Lex, MD;  Location: [facility] INVASIVE CV LAB;  Service: Cardiovascular:: dRCA 100% (DES PCI) with dRPDA 100% from distal embolization, mLAD 90% (Staged DES PCI).    TRANSTHORACIC ECHOCARDIOGRAM  05/27/2019   EF 60 to 65%.  Unable to assess wall motion because of poor images.  GR 1 DD.  Both atria are normal size.  Normal valves.   WRIST SURGERY      Allergies  No Known Allergies   Labs/Other Studies Reviewed    The following studies were reviewed today:  Cardiac Studies & Procedures   CARDIAC CATHETERIZATION  CARDIAC CATHETERIZATION 05/27/2019  Narrative  Mid LAD lesion is 90% stenosed.  Previously placed Mid RCA to Dist RCA drug eluting stent is widely patent.  RPDA lesion is 100% stenosed -stable from 05/26/2019  Post intervention, there is a 0% residual stenosis.  A drug-eluting stent was successfully placed using a STENT SYNERGY DES 2.75X12.  SUMMARY  Successful DES PCI of mid LAD with synergy DES 2.75 mm a 12 mm (2.9 mm)  Patent RCA stent from 05/26/2019, but persistent occlusion of the distal PDA (sidebranches are perfusing better)   RECOMMENDATIONS  Okay to transfer to telemetry/progressive care--anticipate discharge tomorrow  We will discontinue IV heparin.  Continue aggressive risk factor modification  Bryan Lemma, MD  Findings  Coronary Findings Diagnostic  Dominance: Co-dominant  Left Anterior Descending Mid LAD lesion is 90% stenosed. The lesion is located at the major branch, focal and eccentric.  First Diagonal Branch Vessel is small in size.  First Septal Branch Vessel is small in size.  Second Diagonal Branch Vessel is small in size.  Left Circumflex Vessel is large. Vessel is angiographically normal. Terminates as a  major posterior lateral branch  First Obtuse Marginal Branch Vessel is moderate in size Vessel is angiographically normal.  Second Obtuse Marginal Branch Vessel is moderate in size. Vessel is angiographically normal.  Second Left Posterolateral Branch Vessel is angiographically normal.  Left Posterior Atrioventricular Artery Vessel is small in size. Vessel is angiographically normal.  Right Coronary Artery Previously placed Mid RCA to Dist RCA drug eluting stent is widely patent. Vessel is the culprit lesion. The lesion is segmental, irregular, thrombotic and ulcerative.  Acute Marginal Branch Vessel is small in size. The vessel is tortuous.  Right Ventricular Branch Vessel is small in size.  Right Posterior Descending Artery RPDA lesion is 100% stenosed. The lesion is thrombotic. Some of the lesion was treated with daughter technique using 1.5 mm balloon, but not able to get flow distally.  Intervention  Mid LAD lesion Stent Lesion length:  10 mm. CATH VISTA GUIDE 6FR [id number] guide catheter was inserted. Lesion crossed with guidewire using a WIRE ASAHI PROWATER 180CM. Pre-stent angioplasty was performed using a BALLOON SAPPHIRE 2.5X12. Maximum pressure:  12 atm. Inflation time:  20 sec. A drug-eluting stent was successfully placed using a STENT SYNERGY DES 2.75X12. Maximum pressure: 14 atm. Inflation time: 20 sec. Minimum lumen area:  2.9 mm. Stent strut is well apposed. Post-stent angioplasty was not performed. After deployment, the stent appeared to be appropriately sized. Post-Intervention Lesion Assessment The intervention was successful. Pre-interventional TIMI flow is 3. Post-intervention TIMI flow is 3. Treated lesion length:  12 mm. No complications occurred at this lesion. There is a 0% residual stenosis post intervention.   CARDIAC CATHETERIZATION 05/26/2019  Narrative  CULPRIT LESION: Mid RCA to Dist RCA lesion is 100% stenosed.  A drug-eluting stent was  successfully placed using a STENT SYNERGY DES 2.5X28.--2.65 mm  Post intervention, there is a 0% residual stenosis.  RPDA lesion is 100% stenosed. Distalization  -------------------------------------------  Mid LAD lesion is 90% stenosed.  -------------------------------------------  The left ventricular systolic function is normal. The left ventricular ejection fraction is 50-55% by visual estimate. LV end diastolic pressure is moderately elevated.  SUMMARY  Codominant system with Left Posterolateral branches and right PDA  Severe two-vessel CAD: 100% Mid-distalRCA occluded (successful DES PCI with Synergy DES 2.5 mm x 28 mm--2.65 mm) and residual distal embolization to the PDA occluding the distal quarter of the PDA, 90% mid focal LAD lesion at D2  Preserved EF with distal inferoapical hypokinesis and moderate to severely elevated EDP of 20 to 23 mmHg (Acute Diastolic Heart Failure).   RECOMMENDATIONS  Admit to CCU for ongoing care and TR band removal.  With significant LAD lesion, will restart aspirin 8 hours after sheath removal and plan for staged LAD PCI for 05/27/2019  Check fasting lipid panel and A1c  Initiate high-dose/high intensity statin (atorvastatin 80 mg)  Start low-dose beta-blocker  Consider echocardiogram prior to discharge  Smoking cessation counseling   Bryan Lemma, M.D., M.S. Interventional Cardiologist  Findings Coronary Findings Diagnostic  Dominance: Co-dominant  Left Anterior Descending Mid LAD lesion is 90% stenosed. The lesion is located at the major branch, focal  and eccentric.  First Diagonal Branch Vessel is small in size.  First Septal Branch Vessel is small in size.  Second Diagonal Branch Vessel is small in size.  Left Circumflex Vessel is large. Vessel is angiographically normal. Terminates as a major posterior lateral branch  First Obtuse Marginal Branch Vessel is moderate in size Vessel is angiographically  normal.  Second Obtuse Marginal Branch Vessel is moderate in size. Vessel is angiographically normal.  Second Left Posterolateral Branch Vessel is angiographically normal.  Left Posterior Atrioventricular Artery Vessel is small in size. Vessel is angiographically normal.  Right Coronary Artery Mid RCA to Dist RCA lesion is 100% stenosed. Vessel is the culprit lesion. The lesion is segmental, irregular, thrombotic and ulcerative.  Acute Marginal Branch Vessel is small in size. The vessel is tortuous.  Right Ventricular Branch Vessel is small in size.  Right Posterior Descending Artery RPDA lesion is 100% stenosed. The lesion is thrombotic. Some of the lesion was treated with daughter technique using 1.5 mm balloon, but not able to get flow distally.  Intervention  Mid RCA to Dist RCA lesion Stent Lesion length:  24 mm. CATHETER LAUNCHER 6FR JR4 guide catheter was inserted. Lesion crossed with guidewire using a WIRE ASAHI PROWATER 180CM. Pre-stent angioplasty was performed using a BALLOON SAPPHIRE 2.5X12. Maximum pressure:  10 atm. Inflation time:  20 sec. A drug-eluting stent was successfully placed using a STENT SYNERGY DES 2.5X28. Maximum pressure: 14 atm. Inflation time: 20 sec. Minimum lumen area:  2.6 mm. Stent strut is well apposed. Post-stent angioplasty was not performed.  temporary lost wire Stent Post-Intervention Lesion Assessment The intervention was successful. Pre-interventional TIMI flow is 0. Post-intervention TIMI flow is 3. Treated lesion length:  28 mm. At this lesion, a distal embolization occurred. There is a 0% residual stenosis post intervention.     ECHOCARDIOGRAM  ECHOCARDIOGRAM COMPLETE 05/27/2019  Narrative ECHOCARDIOGRAM REPORT    Patient Name:   LOC WROBLESKI Date of Exam: 05/27/2019 Medical Rec #:  [id number]     Height:       74.0 in Accession #:    [id number]    Weight:       330.2 lb Date of Birth:  1975/10/16      BSA:          2.69  m Patient Age:    43 years      BP:           129/88 mmHg Patient Gender: M             HR:           60 bpm. Exam Location:  Inpatient  Procedure: 2D Echo  Indications:    786.50 chest pain  History:        Patient has no prior history of Echocardiogram examinations. Tobacco abuse. CAD. STEMI.  Sonographer:    Celene Skeen RDCS (AE) Referring Phys: [id number] Corrin Parker   Sonographer Comments: Patient is morbidly obese. Image acquisition challenging due to patient body habitus. IMPRESSIONS   1. Left ventricular ejection fraction, by visual estimation, is 60 to 65%. The left ventricle has normal function. There is no left ventricular hypertrophy. Inadequate for regional WMA's. 2. Left ventricular diastolic Doppler parameters are consistent with impaired relaxation pattern of LV diastolic filling. 3. Global right ventricle has normal systolic function.The right ventricular size is normal. No increase in right ventricular wall thickness. 4. Left atrial size was normal. 5. Right atrial size was normal. 6. The mitral  valve is grossly normal. Trace mitral valve regurgitation. 7. The tricuspid valve is grossly normal. Tricuspid valve regurgitation was not visualized by color flow Doppler. 8. The aortic valve is tricuspid Aortic valve regurgitation was not visualized by color flow Doppler. 9. The pulmonic valve was grossly normal. Pulmonic valve regurgitation is not visualized by color flow Doppler.  FINDINGS Left Ventricle: Left ventricular ejection fraction, by visual estimation, is 60 to 65%. The left ventricle has normal function. There is no left ventricular hypertrophy. Spectral Doppler shows Left ventricular diastolic Doppler parameters are consistent with impaired relaxation pattern of LV diastolic filling. Indeterminate filling pressures.  Right Ventricle: The right ventricular size is normal. No increase in right ventricular wall thickness. Global RV systolic function is  has normal systolic function.  Left Atrium: Left atrial size was normal in size.  Right Atrium: Right atrial size was normal in size  Pericardium: There is no evidence of pericardial effusion.  Mitral Valve: The mitral valve is grossly normal. Trace mitral valve regurgitation.  Tricuspid Valve: The tricuspid valve is grossly normal. Tricuspid valve regurgitation was not visualized by color flow Doppler.  Aortic Valve: The aortic valve is tricuspid. Aortic valve regurgitation was not visualized by color flow Doppler.  Pulmonic Valve: The pulmonic valve was grossly normal. Pulmonic valve regurgitation is not visualized by color flow Doppler.  Aorta: The aortic root and ascending aorta are structurally normal, with no evidence of dilitation.  Venous: The inferior vena cava was not well visualized.  IAS/Shunts: No atrial level shunt detected by color flow Doppler.    LEFT VENTRICLE PLAX 2D LVIDd:         5.10 cm  Diastology LVIDs:         3.40 cm  LV e' lateral:   11.90 cm/s LV PW:         1.20 cm  LV E/e' lateral: 5.4 LV IVS:        1.00 cm  LV e' medial:    7.40 cm/s LVOT diam:     2.30 cm  LV E/e' medial:  8.7 LV SV:         76 ml LV SV Index:   26.70 LVOT Area:     4.15 cm   RIGHT VENTRICLE RV Basal diam:  5.60 cm RV S prime:     13.70 cm/s TAPSE (M-mode): 2.1 cm  LEFT ATRIUM             Index       RIGHT ATRIUM           Index LA diam:        4.40 cm 1.64 cm/m  RA Area:     21.40 cm LA Vol (A2C):   63.7 ml 23.68 ml/m RA Volume:   66.30 ml  24.65 ml/m LA Vol (A4C):   55.8 ml 20.75 ml/m LA Biplane Vol: 62.6 ml 23.27 ml/m AORTIC VALVE LVOT Vmax:   76.70 cm/s LVOT Vmean:  56.600 cm/s LVOT VTI:    0.152 m  AORTA Ao Root diam: 3.50 cm  MITRAL VALVE MV Area (PHT): 2.48 cm             SHUNTS MV PHT:        88.74 msec           Systemic VTI:  0.15 m MV Decel Time: 306 msec             Systemic Diam: 2.30 cm MV E velocity: 64.50 cm/s 103 cm/s MV  A velocity:  68.40 cm/s 70.3 cm/s MV E/A ratio:  0.94       1.5   Zoila Shutter MD Electronically signed by Zoila Shutter MD Signature Date/Time: 05/27/2019/3:23:08 PM    Final            Recent Labs: 11/25/2022: ALT 50; BUN 13; Creatinine, Ser 0.54; Potassium 4.9; Sodium 145  Recent Lipid Panel    Component Value Date/Time   CHOL 159 11/25/2022 0909   TRIG 467 (H) 11/25/2022 0909   HDL 40 11/25/2022 0909   CHOLHDL 4.0 11/25/2022 0909   CHOLHDL 5.9 05/27/2019 0220   VLDL 69 (H) 05/27/2019 0220   LDLCALC 50 11/25/2022 0909   LDLDIRECT 106.6 (H) 05/26/2019 0653    History of Present Illness    47 year old with the above past medical history including CAD s/p STEMI, DES-m-dRCA, DES-M LAD in 05/2019, hypertension, hyperlipidemia, type 2 diabetes, former tobacco use, and obesity.   He was hospitalized in 06/14/2019 in the setting of inferior STEMI.  Cardiac catheterization revealed 100% occluded mid to distal RCA s/p DES, RPDA 100% stenosis, mid LAD lesion 90% stenosis for which he underwent staged PCI/DES.  Echocardiogram at the time revealed EF 60 to 65%, no RWMA, normal RV systolic function, no significant valvular abnormalities.  He was last seen in the office on 11/25/2022 and was stable from a cardiac standpoint.  He denied symptoms concerning for angina.  His BP was elevated.  Losartan was switched to olmesartan 40 mg daily.   He presents today for follow-up.  Since his last visit he has done well from a cardiac standpoint.  His BP has remained elevated despite transition from losartan to olmesartan.  He denies any associated symptoms.  Overall, he reports feeling well.  Home Medications    Current Outpatient Medications  Medication Sig Dispense Refill   amLODipine (NORVASC) 5 MG tablet Take 1 tablet (5 mg total) by mouth daily. 90 tablet 3   atorvastatin (LIPITOR) 80 MG tablet TAKE 1 TABLET BY MOUTH DAILY **PLEASE KEEP APPOINTMENT FOR ADDITIONAL REFILLS** 90 tablet 3   carvedilol (COREG)  12.5 MG tablet Take 1 tablet (12.5 mg total) by mouth 2 (two) times daily. 180 tablet 3   clopidogrel (PLAVIX) 75 MG tablet Take 1 tablet (75 mg total) by mouth daily. 90 tablet 3   metFORMIN (GLUCOPHAGE) 500 MG tablet TAKE 2 TABLETS BY MOUTH TWICE A DAY WITH A MEAL 28 tablet 0   nitroGLYCERIN (NITROSTAT) 0.4 MG SL tablet Place 1 tablet (0.4 mg total) under the tongue every 5 (five) minutes as needed for chest pain (CP or SOB). 25 tablet 3   olmesartan (BENICAR) 40 MG tablet Take 1 tablet (40 mg total) by mouth daily. 90 tablet 3   doxycycline (VIBRA-TABS) 100 MG tablet Take 1 tablet (100 mg total) by mouth 2 (two) times daily. (Patient not taking: Reported on 02/24/2023) 20 tablet 0   mupirocin ointment (BACTROBAN) 2 % Apply 1 Application topically 2 (two) times daily. (Patient not taking: Reported on 02/24/2023) 22 g 0   sulfamethoxazole-trimethoprim (BACTRIM DS) 800-160 MG tablet Take 1 tablet by mouth 2 (two) times daily. (Patient not taking: Reported on 02/24/2023)     VASCEPA 1 g capsule Take 2 capsules (2 g total) by mouth 2 (two) times daily. (Patient not taking: Reported on 02/24/2023) 360 capsule 3   No current facility-administered medications for this visit.     Review of Systems    He denies chest pain, palpitations, dyspnea, pnd,  orthopnea, n, v, dizziness, syncope, edema, weight gain, or early satiety. All other systems reviewed and are otherwise negative except as noted above.   Physical Exam    VS:  BP (!) 132/102   Pulse 78   Ht 6\' 2"  (1.88 m)   Wt (!) 389 lb 3.2 oz (176.5 kg)   SpO2 94%   BMI 49.97 kg/m   GEN: Well nourished, well developed, in no acute distress. HEENT: normal. Neck: Supple, no JVD, carotid bruits, or masses. Cardiac: RRR, no murmurs, rubs, or gallops. No clubbing, cyanosis, edema.  Radials/DP/PT 2+ and equal bilaterally.  Respiratory:  Respirations regular and unlabored, clear to auscultation bilaterally. GI: Soft, nontender, nondistended, BS + x 4. MS: no  deformity or atrophy. Skin: warm and dry, no rash. Neuro:  Strength and sensation are intact. Psych: Normal affect.  Accessory Clinical Findings    ECG personally reviewed by me today -   no EKG in office today.    Lab Results  Component Value Date   WBC 12.3 (H) 12/31/2021   HGB 14.8 12/31/2021   HCT 42.6 12/31/2021   MCV 88.2 12/31/2021   PLT 269 12/31/2021   Lab Results  Component Value Date   CREATININE 0.54 (L) 11/25/2022   BUN 13 11/25/2022   NA 145 (H) 11/25/2022   K 4.9 11/25/2022   CL 102 11/25/2022   CO2 27 11/25/2022   Lab Results  Component Value Date   ALT 50 (H) 11/25/2022   AST 28 11/25/2022   ALKPHOS 55 11/25/2022   BILITOT 0.3 11/25/2022   Lab Results  Component Value Date   CHOL 159 11/25/2022   HDL 40 11/25/2022   LDLCALC 50 11/25/2022   LDLDIRECT 106.6 (H) 05/26/2019   TRIG 467 (H) 11/25/2022   CHOLHDL 4.0 11/25/2022    Lab Results  Component Value Date   HGBA1C 6.4 (A) 02/04/2022    Assessment & Plan    1. Hypertension BP remains elevated despite transition from losartan to olmesartan.  Will add amlodipine 5 mg daily. Continue to monitor BP and report BP consistently greater than 130/80.  Continue Olmesartan, carvedilol.     2. CAD: S/p STEMI, DES-m-dRCA, DES-M LAD in 05/2019. Stable with no anginal symptoms. No indication for ischemic evaluation.  Continue Plavix, carvedilol, Olmesartan, amlodipine, and Lipitor.   3. Hyperlipidemia: LDL was 50 in 11/2022. Continue Lipitor.   4. Type 2 diabetes: A1c was 6.4 in 01/2022.  Monitored and managed per PCP.   5. Obesity: Encouraged ongoing lifestyle modifications with diet and exercise.     6. Disposition: Follow-up in 2 months.   HYPERTENSION CONTROL Vitals:   02/24/23 0803 02/24/23 0827  BP: (!) 148/106 (!) 132/102    The patient's blood pressure is elevated above target today.  In order to address the patient's elevated BP: A new medication was prescribed today.; Blood pressure will  be monitored at home to determine if medication changes need to be made.; Follow up with general cardiology has been recommended.      Joylene Grapes, NP 02/24/2023, 8:31 AM

## 2023-02-25 ENCOUNTER — Encounter: Payer: Self-pay | Admitting: Physician Assistant

## 2023-02-25 ENCOUNTER — Ambulatory Visit: Payer: No Typology Code available for payment source | Admitting: Physician Assistant

## 2023-02-25 VITALS — BP 142/98 | HR 88 | Temp 97.8°F | Ht 74.0 in | Wt 388.0 lb

## 2023-02-25 DIAGNOSIS — Z7984 Long term (current) use of oral hypoglycemic drugs: Secondary | ICD-10-CM | POA: Insufficient documentation

## 2023-02-25 DIAGNOSIS — E1165 Type 2 diabetes mellitus with hyperglycemia: Secondary | ICD-10-CM | POA: Diagnosis not present

## 2023-02-25 DIAGNOSIS — Z6841 Body Mass Index (BMI) 40.0 and over, adult: Secondary | ICD-10-CM | POA: Diagnosis not present

## 2023-02-25 DIAGNOSIS — I10 Essential (primary) hypertension: Secondary | ICD-10-CM

## 2023-02-25 DIAGNOSIS — E1169 Type 2 diabetes mellitus with other specified complication: Secondary | ICD-10-CM

## 2023-02-25 LAB — POCT GLYCOSYLATED HEMOGLOBIN (HGB A1C): Hemoglobin A1C: 6.8 % — AB (ref 4.0–5.6)

## 2023-02-25 MED ORDER — METFORMIN HCL 500 MG PO TABS
1000.0000 mg | ORAL_TABLET | Freq: Two times a day (BID) | ORAL | 1 refills | Status: DC
Start: 2023-02-25 — End: 2023-06-24

## 2023-02-25 NOTE — Patient Instructions (Addendum)
Your Ha1c is 6.8% today - still at goal of under 7.0%. Keep taking Metformin as you have been doing. I have sent a referral to see nutritionist to help with dietary changes. Keep trying to work on good exercise and health habits. See you back in 4 months!

## 2023-02-25 NOTE — Assessment & Plan Note (Signed)
Elevated, working with cardiology on lowering this.  Amlodipine 5 mg was added to his regimen yesterday.

## 2023-02-25 NOTE — Assessment & Plan Note (Signed)
High risk for early morbidity / mortality - encouraged him to work hard on lifestyle changes, may need to consider bariatric referral.

## 2023-02-25 NOTE — Progress Notes (Signed)
Subjective:    Patient ID: David Bender, male    DOB: [birth year], 47 y.o.   MRN: [id number]  Chief Complaint  Patient presents with   Diabetes    Pt in office for medication follow up, not seen since CPE 01/2022; pt not monitoring glucose readings regularly; pt states he is fasting today;     Diabetes   Patient is in today for follow-up on diabetes management. He has been taking his medication as normal. No major health changes in the last year per patient.  Busy with work.   No vision changes. No nerve pain. Urination and bowel movements normal. No CP or SOB. No headaches or dizziness. Feeling well overall.    Past Medical History:  Diagnosis Date   Acute ST elevation myocardial infarction (STEMI) of inferior wall (HCC) 05/26/2019   dRCA occluded--DES PCI (distal PDA occluded with distalization.  Codominant circumflex patent.  With staged PCI of 90% mLAD; normal EF on echo   Coronary artery disease involving native coronary artery of native heart with unstable angina pectoris (HCC) 05/26/2019   a. 05/26/2019 PCI: 100% dRCA - DES PCI (Synergy DES 2.5 x 28 - 2.65 mm); pLAD focal 90%.  Co-dominant LCx, normal; b. 05/27/2019 Staged PCI: mLAD 90% (2.75x12 Synergy DES - 2.4mm).   Hyperlipidemia associated with type 2 diabetes mellitus (HCC)    Kidney stone    2023   Morbid obesity with BMI of 45.0-49.9, adult (HCC)    Tobacco abuse - 2PPD 05/26/2019   Type II diabetes mellitus with complication (HCC)    Not currently taking medications    Past Surgical History:  Procedure Laterality Date   CORONARY STENT INTERVENTION N/A 05/27/2019   Procedure: CORONARY STENT INTERVENTION;  Surgeon: Marykay Lex, MD;  Location[facility] INVASIVE CV LAB;  Service: Cardiovascular:: Staged PCI of mid LAD-Synergy DES 2.7 mm at 12 mm (2.9 mm).    CORONARY STENT INTERVENTION N/A 05/26/2019   Procedure: CORONARY STENT INTERVENTION;  Surgeon: Marykay Lex, MD;  Location: [facility] INVASIVE CV LAB;  Service:  Cardiovascular:: distal RCA 100% (DES PCI Synergy 2.5 mm x 20 mm - 2.6 mm).   CORONARY/GRAFT ACUTE MI REVASCULARIZATION N/A 05/26/2019   Procedure: CORONARY/GRAFT ACUTE MI REVASCULARIZATION;  Surgeon: Marykay Lex, MD;  Location: [facility] INVASIVE CV LAB;  Service: Cardiovascular:: PCI of dRCA   LEFT HEART CATH AND CORONARY ANGIOGRAPHY N/A 05/26/2019   Procedure: LEFT HEART CATH AND CORONARY ANGIOGRAPHY;  Surgeon: Marykay Lex, MD;  Location: [facility] INVASIVE CV LAB;  Service: Cardiovascular:: dRCA 100% (DES PCI) with dRPDA 100% from distal embolization, mLAD 90% (Staged DES PCI).    TRANSTHORACIC ECHOCARDIOGRAM  05/27/2019   EF 60 to 65%.  Unable to assess wall motion because of poor images.  GR 1 DD.  Both atria are normal size.  Normal valves.   WRIST SURGERY      Family History  Problem Relation Age of Onset   Diabetes Mother    Heart attack Mother    CAD Mother    Diabetes Father    Cancer Father    Diabetes Brother     Social History   Tobacco Use   Smoking status: Former    Packs/day: 1.50    Years: 10.00    Additional pack years: 0.00    Total pack years: 15.00    Types: Cigarettes    Quit date: 05/27/2019    Years since quitting: 3.7   Smokeless tobacco: Never  Vaping Use  Vaping Use: Never used  Substance Use Topics   Alcohol use: No   Drug use: No     No Known Allergies  Review of Systems NEGATIVE UNLESS OTHERWISE INDICATED IN HPI      Objective:     BP (!) 142/102 (BP Location: Left Arm, Patient Position: Sitting)   Pulse 88   Temp 97.8 F (36.6 C) (Temporal)   Ht 6\' 2"  (1.88 m)   Wt (!) 388 lb (176 kg)   SpO2 97%   BMI 49.82 kg/m   Wt Readings from Last 3 Encounters:  02/25/23 (!) 388 lb (176 kg)  02/24/23 (!) 389 lb 3.2 oz (176.5 kg)  11/25/22 (!) 382 lb (173.3 kg)    BP Readings from Last 3 Encounters:  02/25/23 (!) 142/102  02/24/23 (!) 132/102  11/25/22 (!) 150/100     Physical Exam Vitals and nursing note reviewed.  Constitutional:       General: He is not in acute distress.    Appearance: Normal appearance. He is obese. He is not toxic-appearing.  HENT:     Head: Normocephalic and atraumatic.     Right Ear: External ear normal.  Eyes:     Extraocular Movements: Extraocular movements intact.     Conjunctiva/sclera: Conjunctivae normal.     Pupils: Pupils are equal, round, and reactive to light.  Cardiovascular:     Rate and Rhythm: Normal rate and regular rhythm.     Pulses: Normal pulses.     Heart sounds: Normal heart sounds.  Pulmonary:     Effort: Pulmonary effort is normal.     Breath sounds: Normal breath sounds.  Musculoskeletal:     Cervical back: Normal range of motion and neck supple.     Right lower leg: No edema.     Left lower leg: No edema.  Skin:    General: Skin is warm and dry.     Findings: No lesion or rash.  Neurological:     General: No focal deficit present.     Mental Status: He is alert and oriented to person, place, and time.     Sensory: No sensory deficit.  Psychiatric:        Mood and Affect: Mood normal.        Behavior: Behavior normal.        Assessment & Plan:  Type 2 diabetes mellitus with hyperglycemia, without long-term current use of insulin (HCC) Assessment & Plan: Lab Results  Component Value Date   HGBA1C 6.8 (A) 02/25/2023   HGBA1C 6.4 (A) 02/04/2022   HGBA1C 6.2 (A) 11/05/2021   Stable with Metformin 1000 mg BID, refilled today Needs to work on lifestyle changes majorly - referral sent to nutritionist   Orders: -     POCT glycosylated hemoglobin (Hb A1C) -     Amb Referral to Nutrition and Diabetic Education  Long term current use of oral hypoglycemic drug  Morbid obesity (HCC) Assessment & Plan: High risk for early morbidity / mortality - encouraged him to work hard on lifestyle changes, may need to consider bariatric referral.   Orders: -     Amb Referral to Nutrition and Diabetic Education -     metFORMIN HCl; Take 2 tablets (1,000 mg total)  by mouth 2 (two) times daily with a meal. TAKE 2 TABLETS BY MOUTH TWICE A DAY WITH A MEAL  Dispense: 360 tablet; Refill: 1  Essential hypertension Assessment & Plan: Elevated, working with cardiology on lowering this.  Amlodipine  5 mg was added to his regimen yesterday.    Hyperlipidemia associated with type 2 diabetes mellitus (HCC)       Return in about 4 months (around 06/28/2023) for recheck/follow-up.    Danaiya Steadman M Glover Capano, PA-C

## 2023-02-25 NOTE — Assessment & Plan Note (Signed)
Lab Results  Component Value Date   HGBA1C 6.8 (A) 02/25/2023   HGBA1C 6.4 (A) 02/04/2022   HGBA1C 6.2 (A) 11/05/2021   Stable with Metformin 1000 mg BID, refilled today Needs to work on lifestyle changes majorly - referral sent to nutritionist

## 2023-02-25 NOTE — Addendum Note (Signed)
Addended by: Royann Shivers on: 02/25/2023 09:04 AM   Modules accepted: Orders

## 2023-05-19 ENCOUNTER — Encounter: Payer: Self-pay | Admitting: Nurse Practitioner

## 2023-05-19 ENCOUNTER — Ambulatory Visit: Payer: No Typology Code available for payment source | Attending: Nurse Practitioner | Admitting: Nurse Practitioner

## 2023-05-19 VITALS — BP 130/98 | HR 82 | Ht 74.0 in | Wt 393.4 lb

## 2023-05-19 DIAGNOSIS — I10 Essential (primary) hypertension: Secondary | ICD-10-CM

## 2023-05-19 DIAGNOSIS — E1165 Type 2 diabetes mellitus with hyperglycemia: Secondary | ICD-10-CM | POA: Diagnosis not present

## 2023-05-19 DIAGNOSIS — E785 Hyperlipidemia, unspecified: Secondary | ICD-10-CM | POA: Diagnosis not present

## 2023-05-19 DIAGNOSIS — I2510 Atherosclerotic heart disease of native coronary artery without angina pectoris: Secondary | ICD-10-CM

## 2023-05-19 MED ORDER — CHLORTHALIDONE 25 MG PO TABS: 12.5000 mg | ORAL_TABLET | Freq: Every day | ORAL | 3 refills | Status: DC

## 2023-05-19 NOTE — Patient Instructions (Signed)
Medication Instructions:  Start Chlorthalidone 12.5 1/2 a tablet once a day *If you need a refill on your cardiac medications before your next appointment, please call your pharmacy*   Lab Work: [facility]met in 2 weeks If you have labs (blood work) drawn today and your tests are completely normal, you will receive your results only by: MyChart Message (if you have MyChart) OR A paper copy in the mail If you have any lab test that is abnormal or we need to change your treatment, we will call you to review the results.   Testing/Procedures: No Testing   Follow-Up: At [facility], you and your health needs are our priority.  As part of our continuing mission to provide you with exceptional heart care, we have created designated Provider Care Teams.  These Care Teams include your primary Cardiologist (physician) and Advanced Practice Providers (APPs -  Physician Assistants and Nurse Practitioners) who all work together to provide you with the care you need, when you need it.  We recommend signing up for the patient portal called "MyChart".  Sign up information is provided on this After Visit Summary.  MyChart is used to connect with patients for Virtual Visits (Telemedicine).  Patients are able to view lab/test results, encounter notes, upcoming appointments, etc.  Non-urgent messages can be sent to your provider as well.   To learn more about what you can do with MyChart, go to [email or web address].    Your next appointment:   6 week(s)  Provider:   Bernadene Person, NP  Other Instructions Goal for blood pressure to be below 130/80.

## 2023-05-19 NOTE — Progress Notes (Signed)
Office Visit    Patient Name: David Bender Date of Encounter: 05/19/2023  Primary Care Provider:  Allwardt, Crist Infante, PA-C Primary Cardiologist:  Bryan Lemma, MD  Chief Complaint    47 year old with a history of CAD s/p STEMI, DES-m-dRCA, DES-M LAD in 05/2019, hypertension, hyperlipidemia, type 2 diabetes, former tobacco use, and obesity who presents for follow-up related to CAD and hypertension.   Past Medical History    Past Medical History:  Diagnosis Date   Acute ST elevation myocardial infarction (STEMI) of inferior wall (HCC) 05/26/2019   dRCA occluded--DES PCI (distal PDA occluded with distalization.  Codominant circumflex patent.  With staged PCI of 90% mLAD; normal EF on echo   Coronary artery disease involving native coronary artery of native heart with unstable angina pectoris (HCC) 05/26/2019   a. 05/26/2019 PCI: 100% dRCA - DES PCI (Synergy DES 2.5 x 28 - 2.65 mm); pLAD focal 90%.  Co-dominant LCx, normal; b. 05/27/2019 Staged PCI: mLAD 90% (2.75x12 Synergy DES - 2.58mm).   Hyperlipidemia associated with type 2 diabetes mellitus (HCC)    Kidney stone    2023   Morbid obesity with BMI of 45.0-49.9, adult (HCC)    Tobacco abuse - 2PPD 05/26/2019   Type II diabetes mellitus with complication (HCC)    Not currently taking medications   Past Surgical History:  Procedure Laterality Date   CORONARY STENT INTERVENTION N/A 05/27/2019   Procedure: CORONARY STENT INTERVENTION;  Surgeon: Marykay Lex, MD;  Location: [facility] INVASIVE CV LAB;  Service: Cardiovascular:: Staged PCI of mid LAD-Synergy DES 2.7 mm at 12 mm (2.9 mm).    CORONARY STENT INTERVENTION N/A 05/26/2019   Procedure: CORONARY STENT INTERVENTION;  Surgeon: Marykay Lex, MD;  Location: [facility] INVASIVE CV LAB;  Service: Cardiovascular:: distal RCA 100% (DES PCI Synergy 2.5 mm x 20 mm - 2.6 mm).   CORONARY/GRAFT ACUTE MI REVASCULARIZATION N/A 05/26/2019   Procedure: CORONARY/GRAFT ACUTE MI REVASCULARIZATION;  Surgeon:  Marykay Lex, MD;  Location: [facility] INVASIVE CV LAB;  Service: Cardiovascular:: PCI of dRCA   LEFT HEART CATH AND CORONARY ANGIOGRAPHY N/A 05/26/2019   Procedure: LEFT HEART CATH AND CORONARY ANGIOGRAPHY;  Surgeon: Marykay Lex, MD;  Location: [facility] INVASIVE CV LAB;  Service: Cardiovascular:: dRCA 100% (DES PCI) with dRPDA 100% from distal embolization, mLAD 90% (Staged DES PCI).    TRANSTHORACIC ECHOCARDIOGRAM  05/27/2019   EF 60 to 65%.  Unable to assess wall motion because of poor images.  GR 1 DD.  Both atria are normal size.  Normal valves.   WRIST SURGERY      Allergies  No Known Allergies   Labs/Other Studies Reviewed    The following studies were reviewed today:  Cardiac Studies & Procedures   CARDIAC CATHETERIZATION  CARDIAC CATHETERIZATION 05/27/2019  Narrative  Mid LAD lesion is 90% stenosed.  Previously placed Mid RCA to Dist RCA drug eluting stent is widely patent.  RPDA lesion is 100% stenosed -stable from 05/26/2019  Post intervention, there is a 0% residual stenosis.  A drug-eluting stent was successfully placed using a STENT SYNERGY DES 2.75X12.  SUMMARY  Successful DES PCI of mid LAD with synergy DES 2.75 mm a 12 mm (2.9 mm)  Patent RCA stent from 05/26/2019, but persistent occlusion of the distal PDA (sidebranches are perfusing better)   RECOMMENDATIONS  Okay to transfer to telemetry/progressive care--anticipate discharge tomorrow  We will discontinue IV heparin.  Continue aggressive risk factor modification  Bryan Lemma, MD  Findings  Coronary Findings Diagnostic  Dominance: Co-dominant  Left Anterior Descending Mid LAD lesion is 90% stenosed. The lesion is located at the major branch, focal and eccentric.  First Diagonal Branch Vessel is small in size.  First Septal Branch Vessel is small in size.  Second Diagonal Branch Vessel is small in size.  Left Circumflex Vessel is large. Vessel is angiographically normal. Terminates as a  major posterior lateral branch  First Obtuse Marginal Branch Vessel is moderate in size Vessel is angiographically normal.  Second Obtuse Marginal Branch Vessel is moderate in size. Vessel is angiographically normal.  Second Left Posterolateral Branch Vessel is angiographically normal.  Left Posterior Atrioventricular Artery Vessel is small in size. Vessel is angiographically normal.  Right Coronary Artery Previously placed Mid RCA to Dist RCA drug eluting stent is widely patent. Vessel is the culprit lesion. The lesion is segmental, irregular, thrombotic and ulcerative.  Acute Marginal Branch Vessel is small in size. The vessel is tortuous.  Right Ventricular Branch Vessel is small in size.  Right Posterior Descending Artery RPDA lesion is 100% stenosed. The lesion is thrombotic. Some of the lesion was treated with daughter technique using 1.5 mm balloon, but not able to get flow distally.  Intervention  Mid LAD lesion Stent Lesion length:  10 mm. CATH VISTA GUIDE 6FR [id number] guide catheter was inserted. Lesion crossed with guidewire using a WIRE ASAHI PROWATER 180CM. Pre-stent angioplasty was performed using a BALLOON SAPPHIRE 2.5X12. Maximum pressure:  12 atm. Inflation time:  20 sec. A drug-eluting stent was successfully placed using a STENT SYNERGY DES 2.75X12. Maximum pressure: 14 atm. Inflation time: 20 sec. Minimum lumen area:  2.9 mm. Stent strut is well apposed. Post-stent angioplasty was not performed. After deployment, the stent appeared to be appropriately sized. Post-Intervention Lesion Assessment The intervention was successful. Pre-interventional TIMI flow is 3. Post-intervention TIMI flow is 3. Treated lesion length:  12 mm. No complications occurred at this lesion. There is a 0% residual stenosis post intervention.   CARDIAC CATHETERIZATION 05/26/2019  Narrative  CULPRIT LESION: Mid RCA to Dist RCA lesion is 100% stenosed.  A drug-eluting stent was  successfully placed using a STENT SYNERGY DES 2.5X28.--2.65 mm  Post intervention, there is a 0% residual stenosis.  RPDA lesion is 100% stenosed. Distalization  -------------------------------------------  Mid LAD lesion is 90% stenosed.  -------------------------------------------  The left ventricular systolic function is normal. The left ventricular ejection fraction is 50-55% by visual estimate. LV end diastolic pressure is moderately elevated.  SUMMARY  Codominant system with Left Posterolateral branches and right PDA  Severe two-vessel CAD: 100% Mid-distalRCA occluded (successful DES PCI with Synergy DES 2.5 mm x 28 mm--2.65 mm) and residual distal embolization to the PDA occluding the distal quarter of the PDA, 90% mid focal LAD lesion at D2  Preserved EF with distal inferoapical hypokinesis and moderate to severely elevated EDP of 20 to 23 mmHg (Acute Diastolic Heart Failure).   RECOMMENDATIONS  Admit to CCU for ongoing care and TR band removal.  With significant LAD lesion, will restart aspirin 8 hours after sheath removal and plan for staged LAD PCI for 05/27/2019  Check fasting lipid panel and A1c  Initiate high-dose/high intensity statin (atorvastatin 80 mg)  Start low-dose beta-blocker  Consider echocardiogram prior to discharge  Smoking cessation counseling   Bryan Lemma, M.D., M.S. Interventional Cardiologist  Findings Coronary Findings Diagnostic  Dominance: Co-dominant  Left Anterior Descending Mid LAD lesion is 90% stenosed. The lesion is located at the major branch, focal  and eccentric.  First Diagonal Branch Vessel is small in size.  First Septal Branch Vessel is small in size.  Second Diagonal Branch Vessel is small in size.  Left Circumflex Vessel is large. Vessel is angiographically normal. Terminates as a major posterior lateral branch  First Obtuse Marginal Branch Vessel is moderate in size Vessel is angiographically  normal.  Second Obtuse Marginal Branch Vessel is moderate in size. Vessel is angiographically normal.  Second Left Posterolateral Branch Vessel is angiographically normal.  Left Posterior Atrioventricular Artery Vessel is small in size. Vessel is angiographically normal.  Right Coronary Artery Mid RCA to Dist RCA lesion is 100% stenosed. Vessel is the culprit lesion. The lesion is segmental, irregular, thrombotic and ulcerative.  Acute Marginal Branch Vessel is small in size. The vessel is tortuous.  Right Ventricular Branch Vessel is small in size.  Right Posterior Descending Artery RPDA lesion is 100% stenosed. The lesion is thrombotic. Some of the lesion was treated with daughter technique using 1.5 mm balloon, but not able to get flow distally.  Intervention  Mid RCA to Dist RCA lesion Stent Lesion length:  24 mm. CATHETER LAUNCHER 6FR JR4 guide catheter was inserted. Lesion crossed with guidewire using a WIRE ASAHI PROWATER 180CM. Pre-stent angioplasty was performed using a BALLOON SAPPHIRE 2.5X12. Maximum pressure:  10 atm. Inflation time:  20 sec. A drug-eluting stent was successfully placed using a STENT SYNERGY DES 2.5X28. Maximum pressure: 14 atm. Inflation time: 20 sec. Minimum lumen area:  2.6 mm. Stent strut is well apposed. Post-stent angioplasty was not performed.  temporary lost wire Stent Post-Intervention Lesion Assessment The intervention was successful. Pre-interventional TIMI flow is 0. Post-intervention TIMI flow is 3. Treated lesion length:  28 mm. At this lesion, a distal embolization occurred. There is a 0% residual stenosis post intervention.     ECHOCARDIOGRAM  ECHOCARDIOGRAM COMPLETE 05/27/2019  Narrative ECHOCARDIOGRAM REPORT    Patient Name:   CAMERAN OSTOS Date of Exam: 05/27/2019 Medical Rec #:  [id number]     Height:       74.0 in Accession #:    [id number]    Weight:       330.2 lb Date of Birth:  May 11, 1976      BSA:          2.69  m Patient Age:    43 years      BP:           129/88 mmHg Patient Gender: M             HR:           60 bpm. Exam Location:  Inpatient  Procedure: 2D Echo  Indications:    786.50 chest pain  History:        Patient has no prior history of Echocardiogram examinations. Tobacco abuse. CAD. STEMI.  Sonographer:    Celene Skeen RDCS (AE) Referring Phys: [id number] Corrin Parker   Sonographer Comments: Patient is morbidly obese. Image acquisition challenging due to patient body habitus. IMPRESSIONS   1. Left ventricular ejection fraction, by visual estimation, is 60 to 65%. The left ventricle has normal function. There is no left ventricular hypertrophy. Inadequate for regional WMA's. 2. Left ventricular diastolic Doppler parameters are consistent with impaired relaxation pattern of LV diastolic filling. 3. Global right ventricle has normal systolic function.The right ventricular size is normal. No increase in right ventricular wall thickness. 4. Left atrial size was normal. 5. Right atrial size was normal. 6. The mitral  valve is grossly normal. Trace mitral valve regurgitation. 7. The tricuspid valve is grossly normal. Tricuspid valve regurgitation was not visualized by color flow Doppler. 8. The aortic valve is tricuspid Aortic valve regurgitation was not visualized by color flow Doppler. 9. The pulmonic valve was grossly normal. Pulmonic valve regurgitation is not visualized by color flow Doppler.  FINDINGS Left Ventricle: Left ventricular ejection fraction, by visual estimation, is 60 to 65%. The left ventricle has normal function. There is no left ventricular hypertrophy. Spectral Doppler shows Left ventricular diastolic Doppler parameters are consistent with impaired relaxation pattern of LV diastolic filling. Indeterminate filling pressures.  Right Ventricle: The right ventricular size is normal. No increase in right ventricular wall thickness. Global RV systolic function is  has normal systolic function.  Left Atrium: Left atrial size was normal in size.  Right Atrium: Right atrial size was normal in size  Pericardium: There is no evidence of pericardial effusion.  Mitral Valve: The mitral valve is grossly normal. Trace mitral valve regurgitation.  Tricuspid Valve: The tricuspid valve is grossly normal. Tricuspid valve regurgitation was not visualized by color flow Doppler.  Aortic Valve: The aortic valve is tricuspid. Aortic valve regurgitation was not visualized by color flow Doppler.  Pulmonic Valve: The pulmonic valve was grossly normal. Pulmonic valve regurgitation is not visualized by color flow Doppler.  Aorta: The aortic root and ascending aorta are structurally normal, with no evidence of dilitation.  Venous: The inferior vena cava was not well visualized.  IAS/Shunts: No atrial level shunt detected by color flow Doppler.    LEFT VENTRICLE PLAX 2D LVIDd:         5.10 cm  Diastology LVIDs:         3.40 cm  LV e' lateral:   11.90 cm/s LV PW:         1.20 cm  LV E/e' lateral: 5.4 LV IVS:        1.00 cm  LV e' medial:    7.40 cm/s LVOT diam:     2.30 cm  LV E/e' medial:  8.7 LV SV:         76 ml LV SV Index:   26.70 LVOT Area:     4.15 cm   RIGHT VENTRICLE RV Basal diam:  5.60 cm RV S prime:     13.70 cm/s TAPSE (M-mode): 2.1 cm  LEFT ATRIUM             Index       RIGHT ATRIUM           Index LA diam:        4.40 cm 1.64 cm/m  RA Area:     21.40 cm LA Vol (A2C):   63.7 ml 23.68 ml/m RA Volume:   66.30 ml  24.65 ml/m LA Vol (A4C):   55.8 ml 20.75 ml/m LA Biplane Vol: 62.6 ml 23.27 ml/m AORTIC VALVE LVOT Vmax:   76.70 cm/s LVOT Vmean:  56.600 cm/s LVOT VTI:    0.152 m  AORTA Ao Root diam: 3.50 cm  MITRAL VALVE MV Area (PHT): 2.48 cm             SHUNTS MV PHT:        88.74 msec           Systemic VTI:  0.15 m MV Decel Time: 306 msec             Systemic Diam: 2.30 cm MV E velocity: 64.50 cm/s 103 cm/s MV  A velocity:  68.40 cm/s 70.3 cm/s MV E/A ratio:  0.94       1.5   Zoila Shutter MD Electronically signed by Zoila Shutter MD Signature Date/Time: 05/27/2019/3:23:08 PM    Final            Recent Labs: 11/25/2022: ALT 50; BUN 13; Creatinine, Ser 0.54; Potassium 4.9; Sodium 145  Recent Lipid Panel    Component Value Date/Time   CHOL 159 11/25/2022 0909   TRIG 467 (H) 11/25/2022 0909   HDL 40 11/25/2022 0909   CHOLHDL 4.0 11/25/2022 0909   CHOLHDL 5.9 05/27/2019 0220   VLDL 69 (H) 05/27/2019 0220   LDLCALC 50 11/25/2022 0909   LDLDIRECT 106.6 (H) 05/26/2019 0653    History of Present Illness    47 year old with the above past medical history including CAD s/p STEMI, DES-m-dRCA, DES-M LAD in 05/2019, hypertension, hyperlipidemia, type 2 diabetes, former tobacco use, and obesity.   He was hospitalized in 06/14/2019 in the setting of inferior STEMI.  Cardiac catheterization revealed 100% occluded mid to distal RCA s/p DES, RPDA 100% stenosis, mid LAD lesion 90% stenosis for which he underwent staged PCI/DES.  Echocardiogram at the time revealed EF 60 to 65%, no RWMA, normal RV systolic function, no significant valvular abnormalities.  He was last seen in the office on 02/24/2023 and was stable from a cardiac standpoint.  He denied symptoms concerning for angina.  His BP was elevated despite transition from losartan to olmesartan.  He was started on amlodipine 5 mg daily.   He presents today for follow-up.  Since his last visit he has been stable from a cardiac standpoint.  He denies any symptoms concerning for angina.  BP has been elevated.  He denies any associated symptoms.  Overall, he reports feeling well.  Home Medications    Current Outpatient Medications  Medication Sig Dispense Refill   amLODipine (NORVASC) 5 MG tablet Take 1 tablet (5 mg total) by mouth daily. 90 tablet 3   atorvastatin (LIPITOR) 80 MG tablet TAKE 1 TABLET BY MOUTH DAILY **PLEASE KEEP APPOINTMENT FOR ADDITIONAL REFILLS** 90  tablet 3   carvedilol (COREG) 12.5 MG tablet Take 1 tablet (12.5 mg total) by mouth 2 (two) times daily. 180 tablet 3   chlorthalidone (HYGROTON) 25 MG tablet Take 0.5 tablets (12.5 mg total) by mouth daily. 45 tablet 3   clopidogrel (PLAVIX) 75 MG tablet Take 1 tablet (75 mg total) by mouth daily. 90 tablet 3   metFORMIN (GLUCOPHAGE) 500 MG tablet Take 2 tablets (1,000 mg total) by mouth 2 (two) times daily with a meal. TAKE 2 TABLETS BY MOUTH TWICE A DAY WITH A MEAL 360 tablet 1   nitroGLYCERIN (NITROSTAT) 0.4 MG SL tablet Place 1 tablet (0.4 mg total) under the tongue every 5 (five) minutes as needed for chest pain (CP or SOB). 25 tablet 3   olmesartan (BENICAR) 40 MG tablet Take 1 tablet (40 mg total) by mouth daily. 90 tablet 3   No current facility-administered medications for this visit.     Review of Systems    He denies chest pain, palpitations, dyspnea, pnd, orthopnea, n, v, dizziness, syncope, edema, weight gain, or early satiety. All other systems reviewed and are otherwise negative except as noted above.   Physical Exam    VS:  BP (!) 130/98   Pulse 82   Ht 6\' 2"  (1.88 m)   Wt (!) 393 lb 6.4 oz (178.4 kg)   SpO2 98%   BMI  50.51 kg/m   GEN: Well nourished, well developed, in no acute distress. HEENT: normal. Neck: Supple, no JVD, carotid bruits, or masses. Cardiac: RRR, no murmurs, rubs, or gallops. No clubbing, cyanosis, edema.  Radials/DP/PT 2+ and equal bilaterally.  Respiratory:  Respirations regular and unlabored, clear to auscultation bilaterally. GI: Soft, nontender, nondistended, BS + x 4. MS: no deformity or atrophy. Skin: warm and dry, no rash. Neuro:  Strength and sensation are intact. Psych: Normal affect.  Accessory Clinical Findings    ECG personally reviewed by me today -   no EKG in office today.    Lab Results  Component Value Date   WBC 12.3 (H) 12/31/2021   HGB 14.8 12/31/2021   HCT 42.6 12/31/2021   MCV 88.2 12/31/2021   PLT 269 12/31/2021    Lab Results  Component Value Date   CREATININE 0.54 (L) 11/25/2022   BUN 13 11/25/2022   NA 145 (H) 11/25/2022   K 4.9 11/25/2022   CL 102 11/25/2022   CO2 27 11/25/2022   Lab Results  Component Value Date   ALT 50 (H) 11/25/2022   AST 28 11/25/2022   ALKPHOS 55 11/25/2022   BILITOT 0.3 11/25/2022   Lab Results  Component Value Date   CHOL 159 11/25/2022   HDL 40 11/25/2022   LDLCALC 50 11/25/2022   LDLDIRECT 106.6 (H) 05/26/2019   TRIG 467 (H) 11/25/2022   CHOLHDL 4.0 11/25/2022    Lab Results  Component Value Date   HGBA1C 6.8 (A) 02/25/2023    Assessment & Plan   1. Hypertension BP remains elevated despite transition from losartan to olmesartan and addition of amlodipine.  We discussed possible increase of amlodipine versus addition of thiazide diuretic.  Through shared decision making, will start chlorthalidone 12.5 mg daily.  Will check BMET in 2 weeks. Continue to monitor BP and report BP consistently greater than 130/80.  Continue olmesartan, carvedilol, amlodipine.   2. CAD: S/p STEMI, DES-m-dRCA, DES-M LAD in 05/2019. Stable with no anginal symptoms. No indication for ischemic evaluation.  Continue Plavix, carvedilol, Olmesartan, amlodipine, and Lipitor.   3. Hyperlipidemia: LDL was 50 in 11/2022. Continue Lipitor.   4. Type 2 diabetes/obesity: A1c was 6.8 in 02/2022.  Monitored and managed per PCP.  We discussed possibility of GLP-1.  Apparently he took Mounjaro in the past with good success, but his insurance stopped covering it.  Encouraged ongoing lifestyle modifications with diet and exercise.     5. Disposition: Follow-up in 2 months.   HYPERTENSION CONTROL Vitals:   05/19/23 0756 05/19/23 0830  BP: (!) 162/102 (!) 130/98    The patient's blood pressure is elevated above target today.  In order to address the patient's elevated BP: Blood pressure will be monitored at home to determine if medication changes need to be made.; A new medication was  prescribed today.; Follow up with general cardiology has been recommended.      Joylene Grapes, NP 05/19/2023, 9:07 AM

## 2023-06-09 LAB — BASIC METABOLIC PANEL
BUN/Creatinine Ratio: 21 — ABNORMAL HIGH (ref 9–20)
BUN: 15 mg/dL (ref 6–24)
CO2: 29 mmol/L (ref 20–29)
Calcium: 9.5 mg/dL (ref 8.7–10.2)
Chloride: 96 mmol/L (ref 96–106)
Creatinine, Ser: 0.7 mg/dL — ABNORMAL LOW (ref 0.76–1.27)
Glucose: 167 mg/dL — ABNORMAL HIGH (ref 70–99)
Potassium: 4.7 mmol/L (ref 3.5–5.2)
Sodium: 138 mmol/L (ref 134–144)
eGFR: 116 mL/min/{1.73_m2} (ref >59)

## 2023-06-23 ENCOUNTER — Other Ambulatory Visit: Payer: Self-pay | Admitting: Physician Assistant

## 2023-06-30 ENCOUNTER — Ambulatory Visit: Payer: No Typology Code available for payment source | Admitting: Physician Assistant

## 2023-07-06 ENCOUNTER — Ambulatory Visit: Payer: No Typology Code available for payment source | Attending: Nurse Practitioner | Admitting: Nurse Practitioner

## 2023-07-06 ENCOUNTER — Encounter: Payer: Self-pay | Admitting: Nurse Practitioner

## 2023-07-06 VITALS — BP 122/78 | HR 82 | Ht 74.0 in | Wt 389.0 lb

## 2023-07-06 DIAGNOSIS — E1165 Type 2 diabetes mellitus with hyperglycemia: Secondary | ICD-10-CM | POA: Diagnosis not present

## 2023-07-06 DIAGNOSIS — I2510 Atherosclerotic heart disease of native coronary artery without angina pectoris: Secondary | ICD-10-CM

## 2023-07-06 DIAGNOSIS — E785 Hyperlipidemia, unspecified: Secondary | ICD-10-CM | POA: Diagnosis not present

## 2023-07-06 DIAGNOSIS — I10 Essential (primary) hypertension: Secondary | ICD-10-CM

## 2023-07-06 NOTE — Patient Instructions (Signed)
Medication Instructions:  Your physician recommends that you continue on your current medications as directed. Please refer to the Current Medication list given to you today.  *If you need a refill on your cardiac medications before your next appointment, please call your pharmacy*   Lab Work: NONE ordered at this time of appointment    Testing/Procedures: NONE ordered at this time of appointment     Follow-Up: At [facility], you and your health needs are our priority.  As part of our continuing mission to provide you with exceptional heart care, we have created designated Provider Care Teams.  These Care Teams include your primary Cardiologist (physician) and Advanced Practice Providers (APPs -  Physician Assistants and Nurse Practitioners) who all work together to provide you with the care you need, when you need it.  We recommend signing up for the patient portal called "MyChart".  Sign up information is provided on this After Visit Summary.  MyChart is used to connect with patients for Virtual Visits (Telemedicine).  Patients are able to view lab/test results, encounter notes, upcoming appointments, etc.  Non-urgent messages can be sent to your provider as well.   To learn more about what you can do with MyChart, go to [email or web address].    Your next appointment:   6 month(s)  Provider:   Bryan Lemma, MD  or Bernadene Person, NP

## 2023-07-06 NOTE — Progress Notes (Signed)
Office Visit    Patient Name: David Bender Date of Encounter: 07/06/2023  Primary Care Provider:  Allwardt, Crist Infante, PA-C Primary Cardiologist:  Bryan Lemma, MD  Chief Complaint    47 year old with a history of CAD s/p STEMI, DES-m-dRCA, DES-M LAD in 05/2019, hypertension, hyperlipidemia, type 2 diabetes, former tobacco use, and obesity who presents for follow-up related to CAD and hypertension.   Past Medical History    Past Medical History:  Diagnosis Date   Acute ST elevation myocardial infarction (STEMI) of inferior wall (HCC) 05/26/2019   dRCA occluded--DES PCI (distal PDA occluded with distalization.  Codominant circumflex patent.  With staged PCI of 90% mLAD; normal EF on echo   Coronary artery disease involving native coronary artery of native heart with unstable angina pectoris (HCC) 05/26/2019   a. 05/26/2019 PCI: 100% dRCA - DES PCI (Synergy DES 2.5 x 28 - 2.65 mm); pLAD focal 90%.  Co-dominant LCx, normal; b. 05/27/2019 Staged PCI: mLAD 90% (2.75x12 Synergy DES - 2.54mm).   Hyperlipidemia associated with type 2 diabetes mellitus (HCC)    Kidney stone    2023   Morbid obesity with BMI of 45.0-49.9, adult (HCC)    Tobacco abuse - 2PPD 05/26/2019   Type II diabetes mellitus with complication (HCC)    Not currently taking medications   Past Surgical History:  Procedure Laterality Date   CORONARY STENT INTERVENTION N/A 05/27/2019   Procedure: CORONARY STENT INTERVENTION;  Surgeon: Marykay Lex, MD;  Location: [facility] INVASIVE CV LAB;  Service: Cardiovascular:: Staged PCI of mid LAD-Synergy DES 2.7 mm at 12 mm (2.9 mm).    CORONARY STENT INTERVENTION N/A 05/26/2019   Procedure: CORONARY STENT INTERVENTION;  Surgeon: Marykay Lex, MD;  Location: [facility] INVASIVE CV LAB;  Service: Cardiovascular:: distal RCA 100% (DES PCI Synergy 2.5 mm x 20 mm - 2.6 mm).   CORONARY/GRAFT ACUTE MI REVASCULARIZATION N/A 05/26/2019   Procedure: CORONARY/GRAFT ACUTE MI REVASCULARIZATION;  Surgeon:  Marykay Lex, MD;  Location: [facility] INVASIVE CV LAB;  Service: Cardiovascular:: PCI of dRCA   LEFT HEART CATH AND CORONARY ANGIOGRAPHY N/A 05/26/2019   Procedure: LEFT HEART CATH AND CORONARY ANGIOGRAPHY;  Surgeon: Marykay Lex, MD;  Location: [facility] INVASIVE CV LAB;  Service: Cardiovascular:: dRCA 100% (DES PCI) with dRPDA 100% from distal embolization, mLAD 90% (Staged DES PCI).    TRANSTHORACIC ECHOCARDIOGRAM  05/27/2019   EF 60 to 65%.  Unable to assess wall motion because of poor images.  GR 1 DD.  Both atria are normal size.  Normal valves.   WRIST SURGERY      Allergies  No Known Allergies   Labs/Other Studies Reviewed    The following studies were reviewed today:  Cardiac Studies & Procedures   CARDIAC CATHETERIZATION  CARDIAC CATHETERIZATION 05/27/2019  Narrative  Mid LAD lesion is 90% stenosed.  Previously placed Mid RCA to Dist RCA drug eluting stent is widely patent.  RPDA lesion is 100% stenosed -stable from 05/26/2019  Post intervention, there is a 0% residual stenosis.  A drug-eluting stent was successfully placed using a STENT SYNERGY DES 2.75X12.  SUMMARY  Successful DES PCI of mid LAD with synergy DES 2.75 mm a 12 mm (2.9 mm)  Patent RCA stent from 05/26/2019, but persistent occlusion of the distal PDA (sidebranches are perfusing better)   RECOMMENDATIONS  Okay to transfer to telemetry/progressive care--anticipate discharge tomorrow  We will discontinue IV heparin.  Continue aggressive risk factor modification  Bryan Lemma, MD  Findings  Coronary Findings Diagnostic  Dominance: Co-dominant  Left Anterior Descending Mid LAD lesion is 90% stenosed. The lesion is located at the major branch, focal and eccentric.  First Diagonal Branch Vessel is small in size.  First Septal Branch Vessel is small in size.  Second Diagonal Branch Vessel is small in size.  Left Circumflex Vessel is large. Vessel is angiographically normal. Terminates as a  major posterior lateral branch  First Obtuse Marginal Branch Vessel is moderate in size Vessel is angiographically normal.  Second Obtuse Marginal Branch Vessel is moderate in size. Vessel is angiographically normal.  Second Left Posterolateral Branch Vessel is angiographically normal.  Left Posterior Atrioventricular Artery Vessel is small in size. Vessel is angiographically normal.  Right Coronary Artery Previously placed Mid RCA to Dist RCA drug eluting stent is widely patent. Vessel is the culprit lesion. The lesion is segmental, irregular, thrombotic and ulcerative.  Acute Marginal Branch Vessel is small in size. The vessel is tortuous.  Right Ventricular Branch Vessel is small in size.  Right Posterior Descending Artery RPDA lesion is 100% stenosed. The lesion is thrombotic. Some of the lesion was treated with daughter technique using 1.5 mm balloon, but not able to get flow distally.  Intervention  Mid LAD lesion Stent Lesion length:  10 mm. CATH VISTA GUIDE 6FR [id number] guide catheter was inserted. Lesion crossed with guidewire using a WIRE ASAHI PROWATER 180CM. Pre-stent angioplasty was performed using a BALLOON SAPPHIRE 2.5X12. Maximum pressure:  12 atm. Inflation time:  20 sec. A drug-eluting stent was successfully placed using a STENT SYNERGY DES 2.75X12. Maximum pressure: 14 atm. Inflation time: 20 sec. Minimum lumen area:  2.9 mm. Stent strut is well apposed. Post-stent angioplasty was not performed. After deployment, the stent appeared to be appropriately sized. Post-Intervention Lesion Assessment The intervention was successful. Pre-interventional TIMI flow is 3. Post-intervention TIMI flow is 3. Treated lesion length:  12 mm. No complications occurred at this lesion. There is a 0% residual stenosis post intervention.   CARDIAC CATHETERIZATION 05/26/2019  Narrative  CULPRIT LESION: Mid RCA to Dist RCA lesion is 100% stenosed.  A drug-eluting stent was  successfully placed using a STENT SYNERGY DES 2.5X28.--2.65 mm  Post intervention, there is a 0% residual stenosis.  RPDA lesion is 100% stenosed. Distalization  -------------------------------------------  Mid LAD lesion is 90% stenosed.  -------------------------------------------  The left ventricular systolic function is normal. The left ventricular ejection fraction is 50-55% by visual estimate. LV end diastolic pressure is moderately elevated.  SUMMARY  Codominant system with Left Posterolateral branches and right PDA  Severe two-vessel CAD: 100% Mid-distalRCA occluded (successful DES PCI with Synergy DES 2.5 mm x 28 mm--2.65 mm) and residual distal embolization to the PDA occluding the distal quarter of the PDA, 90% mid focal LAD lesion at D2  Preserved EF with distal inferoapical hypokinesis and moderate to severely elevated EDP of 20 to 23 mmHg (Acute Diastolic Heart Failure).   RECOMMENDATIONS  Admit to CCU for ongoing care and TR band removal.  With significant LAD lesion, will restart aspirin 8 hours after sheath removal and plan for staged LAD PCI for 05/27/2019  Check fasting lipid panel and A1c  Initiate high-dose/high intensity statin (atorvastatin 80 mg)  Start low-dose beta-blocker  Consider echocardiogram prior to discharge  Smoking cessation counseling   Bryan Lemma, M.D., M.S. Interventional Cardiologist  Findings Coronary Findings Diagnostic  Dominance: Co-dominant  Left Anterior Descending Mid LAD lesion is 90% stenosed. The lesion is located at the major branch, focal  and eccentric.  First Diagonal Branch Vessel is small in size.  First Septal Branch Vessel is small in size.  Second Diagonal Branch Vessel is small in size.  Left Circumflex Vessel is large. Vessel is angiographically normal. Terminates as a major posterior lateral branch  First Obtuse Marginal Branch Vessel is moderate in size Vessel is angiographically  normal.  Second Obtuse Marginal Branch Vessel is moderate in size. Vessel is angiographically normal.  Second Left Posterolateral Branch Vessel is angiographically normal.  Left Posterior Atrioventricular Artery Vessel is small in size. Vessel is angiographically normal.  Right Coronary Artery Mid RCA to Dist RCA lesion is 100% stenosed. Vessel is the culprit lesion. The lesion is segmental, irregular, thrombotic and ulcerative.  Acute Marginal Branch Vessel is small in size. The vessel is tortuous.  Right Ventricular Branch Vessel is small in size.  Right Posterior Descending Artery RPDA lesion is 100% stenosed. The lesion is thrombotic. Some of the lesion was treated with daughter technique using 1.5 mm balloon, but not able to get flow distally.  Intervention  Mid RCA to Dist RCA lesion Stent Lesion length:  24 mm. CATHETER LAUNCHER 6FR JR4 guide catheter was inserted. Lesion crossed with guidewire using a WIRE ASAHI PROWATER 180CM. Pre-stent angioplasty was performed using a BALLOON SAPPHIRE 2.5X12. Maximum pressure:  10 atm. Inflation time:  20 sec. A drug-eluting stent was successfully placed using a STENT SYNERGY DES 2.5X28. Maximum pressure: 14 atm. Inflation time: 20 sec. Minimum lumen area:  2.6 mm. Stent strut is well apposed. Post-stent angioplasty was not performed.  temporary lost wire Stent Post-Intervention Lesion Assessment The intervention was successful. Pre-interventional TIMI flow is 0. Post-intervention TIMI flow is 3. Treated lesion length:  28 mm. At this lesion, a distal embolization occurred. There is a 0% residual stenosis post intervention.     ECHOCARDIOGRAM  ECHOCARDIOGRAM COMPLETE 05/27/2019  Narrative ECHOCARDIOGRAM REPORT    Patient Name:   JONANTHAN ANDREOLA Date of Exam: 05/27/2019 Medical Rec #:  [id number]     Height:       74.0 in Accession #:    [id number]    Weight:       330.2 lb Date of Birth:  Jan 10, 1976      BSA:          2.69  m Patient Age:    43 years      BP:           129/88 mmHg Patient Gender: M             HR:           60 bpm. Exam Location:  Inpatient  Procedure: 2D Echo  Indications:    786.50 chest pain  History:        Patient has no prior history of Echocardiogram examinations. Tobacco abuse. CAD. STEMI.  Sonographer:    Celene Skeen RDCS (AE) Referring Phys: [id number] Corrin Parker   Sonographer Comments: Patient is morbidly obese. Image acquisition challenging due to patient body habitus. IMPRESSIONS   1. Left ventricular ejection fraction, by visual estimation, is 60 to 65%. The left ventricle has normal function. There is no left ventricular hypertrophy. Inadequate for regional WMA's. 2. Left ventricular diastolic Doppler parameters are consistent with impaired relaxation pattern of LV diastolic filling. 3. Global right ventricle has normal systolic function.The right ventricular size is normal. No increase in right ventricular wall thickness. 4. Left atrial size was normal. 5. Right atrial size was normal. 6. The mitral  valve is grossly normal. Trace mitral valve regurgitation. 7. The tricuspid valve is grossly normal. Tricuspid valve regurgitation was not visualized by color flow Doppler. 8. The aortic valve is tricuspid Aortic valve regurgitation was not visualized by color flow Doppler. 9. The pulmonic valve was grossly normal. Pulmonic valve regurgitation is not visualized by color flow Doppler.  FINDINGS Left Ventricle: Left ventricular ejection fraction, by visual estimation, is 60 to 65%. The left ventricle has normal function. There is no left ventricular hypertrophy. Spectral Doppler shows Left ventricular diastolic Doppler parameters are consistent with impaired relaxation pattern of LV diastolic filling. Indeterminate filling pressures.  Right Ventricle: The right ventricular size is normal. No increase in right ventricular wall thickness. Global RV systolic function is  has normal systolic function.  Left Atrium: Left atrial size was normal in size.  Right Atrium: Right atrial size was normal in size  Pericardium: There is no evidence of pericardial effusion.  Mitral Valve: The mitral valve is grossly normal. Trace mitral valve regurgitation.  Tricuspid Valve: The tricuspid valve is grossly normal. Tricuspid valve regurgitation was not visualized by color flow Doppler.  Aortic Valve: The aortic valve is tricuspid. Aortic valve regurgitation was not visualized by color flow Doppler.  Pulmonic Valve: The pulmonic valve was grossly normal. Pulmonic valve regurgitation is not visualized by color flow Doppler.  Aorta: The aortic root and ascending aorta are structurally normal, with no evidence of dilitation.  Venous: The inferior vena cava was not well visualized.  IAS/Shunts: No atrial level shunt detected by color flow Doppler.    LEFT VENTRICLE PLAX 2D LVIDd:         5.10 cm  Diastology LVIDs:         3.40 cm  LV e' lateral:   11.90 cm/s LV PW:         1.20 cm  LV E/e' lateral: 5.4 LV IVS:        1.00 cm  LV e' medial:    7.40 cm/s LVOT diam:     2.30 cm  LV E/e' medial:  8.7 LV SV:         76 ml LV SV Index:   26.70 LVOT Area:     4.15 cm   RIGHT VENTRICLE RV Basal diam:  5.60 cm RV S prime:     13.70 cm/s TAPSE (M-mode): 2.1 cm  LEFT ATRIUM             Index       RIGHT ATRIUM           Index LA diam:        4.40 cm 1.64 cm/m  RA Area:     21.40 cm LA Vol (A2C):   63.7 ml 23.68 ml/m RA Volume:   66.30 ml  24.65 ml/m LA Vol (A4C):   55.8 ml 20.75 ml/m LA Biplane Vol: 62.6 ml 23.27 ml/m AORTIC VALVE LVOT Vmax:   76.70 cm/s LVOT Vmean:  56.600 cm/s LVOT VTI:    0.152 m  AORTA Ao Root diam: 3.50 cm  MITRAL VALVE MV Area (PHT): 2.48 cm             SHUNTS MV PHT:        88.74 msec           Systemic VTI:  0.15 m MV Decel Time: 306 msec             Systemic Diam: 2.30 cm MV E velocity: 64.50 cm/s 103 cm/s MV  A velocity:  68.40 cm/s 70.3 cm/s MV E/A ratio:  0.94       1.5   Zoila Shutter MD Electronically signed by Zoila Shutter MD Signature Date/Time: 05/27/2019/3:23:08 PM    Final            Recent Labs: 11/25/2022: ALT 50 06/08/2023: BUN 15; Creatinine, Ser 0.70; Potassium 4.7; Sodium 138  Recent Lipid Panel    Component Value Date/Time   CHOL 159 11/25/2022 0909   TRIG 467 (H) 11/25/2022 0909   HDL 40 11/25/2022 0909   CHOLHDL 4.0 11/25/2022 0909   CHOLHDL 5.9 05/27/2019 0220   VLDL 69 (H) 05/27/2019 0220   LDLCALC 50 11/25/2022 0909   LDLDIRECT 106.6 (H) 05/26/2019 0653    History of Present Illness    48 year old with the above past medical history including CAD s/p STEMI, DES-m-dRCA, DES-M LAD in 05/2019, hypertension, hyperlipidemia, type 2 diabetes, former tobacco use, and obesity.   He was hospitalized in 06/14/2019 in the setting of inferior STEMI.  Cardiac catheterization revealed 100% occluded mid to distal RCA s/p DES, RPDA 100% stenosis, mid LAD lesion 90% stenosis for which he underwent staged PCI/DES.  Echocardiogram at the time revealed EF 60 to 65%, no RWMA, normal RV systolic function, no significant valvular abnormalities.  He was last seen in the office on 05/19/2023 and was stable from a cardiac standpoint.  He denied symptoms concerning for angina.  His BP was elevated. He was started on chlorthalidone.    He presents today for follow-up.  Since his last visit he has done well from a cardiac standpoint.  He denies any symptoms concerning for angina.  BP has improved with addition of chlorthalidone.  Overall, he reports feeling well.  Home Medications    Current Outpatient Medications  Medication Sig Dispense Refill   amLODipine (NORVASC) 5 MG tablet Take 1 tablet (5 mg total) by mouth daily. 90 tablet 3   atorvastatin (LIPITOR) 80 MG tablet TAKE 1 TABLET BY MOUTH DAILY **PLEASE KEEP APPOINTMENT FOR ADDITIONAL REFILLS** 90 tablet 3   carvedilol (COREG) 12.5 MG tablet Take  1 tablet (12.5 mg total) by mouth 2 (two) times daily. 180 tablet 3   chlorthalidone (HYGROTON) 25 MG tablet Take 0.5 tablets (12.5 mg total) by mouth daily. 45 tablet 3   clopidogrel (PLAVIX) 75 MG tablet Take 1 tablet (75 mg total) by mouth daily. 90 tablet 3   metFORMIN (GLUCOPHAGE) 500 MG tablet TAKE 2 TABLETS BY MOUTH TWICE A DAY WITH MEALS 120 tablet 0   olmesartan (BENICAR) 40 MG tablet Take 1 tablet (40 mg total) by mouth daily. 90 tablet 3   nitroGLYCERIN (NITROSTAT) 0.4 MG SL tablet Place 1 tablet (0.4 mg total) under the tongue every 5 (five) minutes as needed for chest pain (CP or SOB). (Patient not taking: Reported on 07/06/2023) 25 tablet 3   No current facility-administered medications for this visit.     Review of Systems    He denies chest pain, palpitations, dyspnea, pnd, orthopnea, n, v, dizziness, syncope, edema, weight gain, or early satiety. All other systems reviewed and are otherwise negative except as noted above.   Physical Exam    VS:  BP 122/78 (BP Location: Left Arm, Patient Position: Sitting, Cuff Size: Large)   Pulse 82   Ht 6\' 2"  (1.88 m)   Wt (!) 389 lb (176.4 kg)   SpO2 97%   BMI 49.94 kg/m   GEN: Well nourished, well developed, in no acute distress. HEENT:  normal. Neck: Supple, no JVD, carotid bruits, or masses. Cardiac: RRR, no murmurs, rubs, or gallops. No clubbing, cyanosis, edema.  Radials/DP/PT 2+ and equal bilaterally.  Respiratory:  Respirations regular and unlabored, clear to auscultation bilaterally. GI: Soft, nontender, nondistended, BS + x 4. MS: no deformity or atrophy. Skin: warm and dry, no rash. Neuro:  Strength and sensation are intact. Psych: Normal affect.  Accessory Clinical Findings    ECG personally reviewed by me today -    - no EKG in office today.   Lab Results  Component Value Date   WBC 12.3 (H) 12/31/2021   HGB 14.8 12/31/2021   HCT 42.6 12/31/2021   MCV 88.2 12/31/2021   PLT 269 12/31/2021   Lab Results   Component Value Date   CREATININE 0.70 (L) 06/08/2023   BUN 15 06/08/2023   NA 138 06/08/2023   K 4.7 06/08/2023   CL 96 06/08/2023   CO2 29 06/08/2023   Lab Results  Component Value Date   ALT 50 (H) 11/25/2022   AST 28 11/25/2022   ALKPHOS 55 11/25/2022   BILITOT 0.3 11/25/2022   Lab Results  Component Value Date   CHOL 159 11/25/2022   HDL 40 11/25/2022   LDLCALC 50 11/25/2022   LDLDIRECT 106.6 (H) 05/26/2019   TRIG 467 (H) 11/25/2022   CHOLHDL 4.0 11/25/2022    Lab Results  Component Value Date   HGBA1C 6.8 (A) 02/25/2023    Assessment & Plan    1. Hypertension: BP well controlled. Continue current antihypertensive regimen. Continue to monitor BP and report BP consistently greater than 130/80.     2. CAD: S/p STEMI, DES-m-dRCA, DES-M LAD in 05/2019. Stable with no anginal symptoms. No indication for ischemic evaluation.  Continue Plavix, carvedilol, Olmesartan, amlodipine, chlorthalidone, and Lipitor.   3. Hyperlipidemia: LDL was 50 in 11/2022. Continue Lipitor.   4. Type 2 diabetes/obesity: A1c was 6.8 in 02/2022.  Monitored and managed per PCP.  We discussed possibility of GLP-1.  Apparently he took Mounjaro in the past with good success, but his insurance stopped covering it.  Hopefully he can revisit this at some point in the future.  Encouraged ongoing lifestyle modifications with diet and exercise.     5. Disposition: Follow-up in 6 months, sooner if needed.       Joylene Grapes, NP 07/06/2023, 8:38 AM

## 2023-07-27 ENCOUNTER — Other Ambulatory Visit: Payer: Self-pay | Admitting: Physician Assistant

## 2023-08-01 ENCOUNTER — Other Ambulatory Visit: Payer: Self-pay | Admitting: Physician Assistant

## 2023-08-12 ENCOUNTER — Other Ambulatory Visit: Payer: Self-pay | Admitting: Nurse Practitioner

## 2023-08-20 ENCOUNTER — Other Ambulatory Visit: Payer: Self-pay | Admitting: Nurse Practitioner

## 2023-08-23 ENCOUNTER — Other Ambulatory Visit: Payer: Self-pay | Admitting: Physician Assistant

## 2023-08-27 ENCOUNTER — Encounter: Payer: Self-pay | Admitting: Physician Assistant

## 2023-08-27 ENCOUNTER — Ambulatory Visit (INDEPENDENT_AMBULATORY_CARE_PROVIDER_SITE_OTHER): Payer: No Typology Code available for payment source | Admitting: Physician Assistant

## 2023-08-27 VITALS — BP 116/80 | HR 90 | Temp 97.3°F | Ht 74.0 in | Wt 389.2 lb

## 2023-08-27 DIAGNOSIS — I10 Essential (primary) hypertension: Secondary | ICD-10-CM

## 2023-08-27 DIAGNOSIS — E1165 Type 2 diabetes mellitus with hyperglycemia: Secondary | ICD-10-CM | POA: Diagnosis not present

## 2023-08-27 DIAGNOSIS — Z23 Encounter for immunization: Secondary | ICD-10-CM

## 2023-08-27 DIAGNOSIS — R7401 Elevation of levels of liver transaminase levels: Secondary | ICD-10-CM

## 2023-08-27 DIAGNOSIS — Z7984 Long term (current) use of oral hypoglycemic drugs: Secondary | ICD-10-CM

## 2023-08-27 DIAGNOSIS — Z6841 Body Mass Index (BMI) 40.0 and over, adult: Secondary | ICD-10-CM

## 2023-08-27 LAB — COMPREHENSIVE METABOLIC PANEL
ALT: 47 U/L (ref 0–53)
AST: 25 U/L (ref 0–37)
Albumin: 4.4 g/dL (ref 3.5–5.2)
Alkaline Phosphatase: 46 U/L (ref 39–117)
BUN: 15 mg/dL (ref 6–23)
CO2: 33 meq/L — ABNORMAL HIGH (ref 19–32)
Calcium: 9.4 mg/dL (ref 8.4–10.5)
Chloride: 97 meq/L (ref 96–112)
Creatinine, Ser: 0.72 mg/dL (ref 0.40–1.50)
GFR: 110.48 mL/min (ref >60.00)
Glucose, Bld: 195 mg/dL — ABNORMAL HIGH (ref 70–99)
Potassium: 4.2 meq/L (ref 3.5–5.1)
Sodium: 140 meq/L (ref 135–145)
Total Bilirubin: 0.4 mg/dL (ref 0.2–1.2)
Total Protein: 6.8 g/dL (ref 6.0–8.3)

## 2023-08-27 LAB — HEMOGLOBIN A1C: Hgb A1c MFr Bld: 8.3 % — ABNORMAL HIGH (ref 4.6–6.5)

## 2023-08-27 MED ORDER — METFORMIN HCL 500 MG PO TABS: 1000.0000 mg | ORAL_TABLET | Freq: Two times a day (BID) | ORAL | 3 refills | Status: DC

## 2023-08-27 MED ORDER — TIRZEPATIDE 2.5 MG/0.5ML ~~LOC~~ SOAJ: 2.5000 mg | SUBCUTANEOUS | 0 refills | Status: AC

## 2023-08-27 NOTE — Progress Notes (Signed)
 Patient ID: David Bender, male    DOB: 1976/01/18, 47 y.o.   MRN: [id number]   Assessment & Plan:  Type 2 diabetes mellitus with hyperglycemia, without long-term current use of insulin (HCC) -     Microalbumin / creatinine urine ratio -     Hemoglobin A1c -     Comprehensive metabolic panel -     Tirzepatide ; Inject 2.5 mg into the skin once a week.  Dispense: 2 mL; Refill: 0  Essential hypertension  Elevated ALT measurement -     Comprehensive metabolic panel  Morbid obesity (HCC) -     Tirzepatide ; Inject 2.5 mg into the skin once a week.  Dispense: 2 mL; Refill: 0 -     metFORMIN  HCl; Take 2 tablets (1,000 mg total) by mouth 2 (two) times daily with a meal.  Dispense: 120 tablet; Refill: 3  Immunization due -     Flu vaccine trivalent PF, 6mos and older(Flulaval,Afluria,Fluarix,Fluzone)   Assessment and Plan    Type 2 Diabetes Mellitus On Metformin  500mg  two twice daily. Discussed the potential addition of Mounjaro , a GLP-1 agonist, to further control blood glucose levels. Patient expressed concerns about long-term effects of the medication, although he took it previously with success. -Refill Metformin  prescription. -Attempt to add Mounjaro  to the regimen, pending insurance approval. Lab Results  Component Value Date   HGBA1C 6.8 (A) 02/25/2023   HGBA1C 6.4 (A) 02/04/2022   HGBA1C 6.2 (A) 11/05/2021    Hypertension Well controlled on current regimen. Follows with cardiology. -Continue current antihypertensive regimen. -Strongly encouraged to keep working on lifestyle changes.   General Health Maintenance -Administer influenza vaccine today. -Order labs to check kidney and liver function, as well as diabetes control. -Schedule follow-up appointment in 4-6 months (May-July 2025).         Return in about 4 months (around 12/25/2023) for recheck/follow-up.    Subjective:    Chief Complaint  Patient presents with   Diabetes    Pt in office for diabetes f/u  with PCP; pt due for A1C check and kidney eval;     Diabetes   Discussed the use of AI scribe software for clinical note transcription with the patient, who gave verbal consent to proceed.  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 He reports variable sleep patterns due to his work schedule, often waking up two to three times a night, but sometimes sleeping through the night. He is currently on metformin  for diabetes management. His blood pressure is well-controlled, and he is working with a cardiology team.  He has been denied coverage for certain diabetes medications in the past, but is open to trying new treatments if covered by his insurance.       Past Medical History:  Diagnosis Date   Acute ST elevation myocardial infarction (STEMI) of inferior wall (HCC) 05/26/2019   dRCA occluded--DES PCI (distal PDA occluded with distalization.  Codominant circumflex patent.  With staged PCI of 90% mLAD; normal EF on echo   Coronary artery disease involving native coronary artery of native heart with unstable angina pectoris (HCC) 05/26/2019   a. 05/26/2019 PCI: 100% dRCA - DES PCI (Synergy DES 2.5 x 28 - 2.65 mm); pLAD focal 90%.  Co-dominant LCx, normal; b. 05/27/2019 Staged PCI: mLAD 90% (2.75x12 Synergy DES - 2.51mm).   Hyperlipidemia associated with type 2 diabetes mellitus (HCC)  Kidney stone    2023   Morbid obesity with BMI of 45.0-49.9, adult (HCC)    Tobacco abuse - 2PPD 05/26/2019   Type II diabetes mellitus with complication (HCC)    Not currently taking medications    Past Surgical History:  Procedure Laterality Date   CORONARY STENT INTERVENTION N/A 05/27/2019   Procedure: CORONARY STENT INTERVENTION;  Surgeon: Anner Alm ORN, MD;  Location: [facility] INVASIVE CV LAB;  Service: Cardiovascular:: Staged PCI of mid LAD-Synergy DES 2.7 mm at 12 mm (2.9 mm).    CORONARY STENT  INTERVENTION N/A 05/26/2019   Procedure: CORONARY STENT INTERVENTION;  Surgeon: Anner Alm ORN, MD;  Location: [facility] INVASIVE CV LAB;  Service: Cardiovascular:: distal RCA 100% (DES PCI Synergy 2.5 mm x 20 mm - 2.6 mm).   CORONARY/GRAFT ACUTE MI REVASCULARIZATION N/A 05/26/2019   Procedure: CORONARY/GRAFT ACUTE MI REVASCULARIZATION;  Surgeon: Anner Alm ORN, MD;  Location: [facility] INVASIVE CV LAB;  Service: Cardiovascular:: PCI of dRCA   LEFT HEART CATH AND CORONARY ANGIOGRAPHY N/A 05/26/2019   Procedure: LEFT HEART CATH AND CORONARY ANGIOGRAPHY;  Surgeon: Anner Alm ORN, MD;  Location: [facility] INVASIVE CV LAB;  Service: Cardiovascular:: dRCA 100% (DES PCI) with dRPDA 100% from distal embolization, mLAD 90% (Staged DES PCI).    TRANSTHORACIC ECHOCARDIOGRAM  05/27/2019   EF 60 to 65%.  Unable to assess wall motion because of poor images.  GR 1 DD.  Both atria are normal size.  Normal valves.   WRIST SURGERY      Family History  Problem Relation Age of Onset   Diabetes Mother    Heart attack Mother    CAD Mother    Diabetes Father    Cancer Father    Diabetes Brother     Social History   Tobacco Use   Smoking status: Former    Current packs/day: 0.00    Average packs/day: 1.5 packs/day for 10.0 years (15.0 ttl pk-yrs)    Types: Cigarettes    Start date: 05/26/2009    Quit date: 05/27/2019    Years since quitting: 4.2   Smokeless tobacco: Never  Vaping Use   Vaping status: Never Used  Substance Use Topics   Alcohol use: No   Drug use: No     No Known Allergies  Review of Systems NEGATIVE UNLESS OTHERWISE INDICATED IN HPI      Objective:     BP 116/80 (BP Location: Left Arm, Patient Position: Sitting, Cuff Size: Large)   Pulse 90   Temp (!) 97.3 F (36.3 C) (Temporal)   Ht 6' 2 (1.88 m)   Wt (!) 389 lb 3.2 oz (176.5 kg)   SpO2 95%   BMI 49.97 kg/m   Wt Readings from Last 3 Encounters:  08/27/23 (!) 389 lb 3.2 oz (176.5 kg)  07/06/23 (!) 389 lb (176.4 kg)  05/19/23 (!)  393 lb 6.4 oz (178.4 kg)    BP Readings from Last 3 Encounters:  08/27/23 116/80  07/06/23 122/78  05/19/23 (!) 130/98     Physical Exam Vitals and nursing note reviewed.  Constitutional:      General: He is not in acute distress.    Appearance: Normal appearance. He is obese. He is not toxic-appearing.  HENT:     Head: Normocephalic and atraumatic.     Right Ear: External ear normal.  Eyes:     Extraocular Movements: Extraocular movements intact.     Conjunctiva/sclera: Conjunctivae normal.     Pupils: Pupils are equal,  round, and reactive to light.  Cardiovascular:     Rate and Rhythm: Normal rate and regular rhythm.     Pulses: Normal pulses.     Heart sounds: Normal heart sounds.  Pulmonary:     Effort: Pulmonary effort is normal.     Breath sounds: Normal breath sounds.  Musculoskeletal:     Cervical back: Normal range of motion and neck supple.     Right lower leg: No edema.     Left lower leg: No edema.  Skin:    General: Skin is warm and dry.     Findings: No lesion or rash.  Neurological:     General: No focal deficit present.     Mental Status: He is alert and oriented to person, place, and time.     Sensory: No sensory deficit.  Psychiatric:        Mood and Affect: Mood normal.        Behavior: Behavior normal.          Ho Parisi M Cicero Noy, PA-C

## 2023-10-11 ENCOUNTER — Other Ambulatory Visit: Payer: Self-pay | Admitting: Nurse Practitioner

## 2023-10-11 DIAGNOSIS — I2510 Atherosclerotic heart disease of native coronary artery without angina pectoris: Secondary | ICD-10-CM

## 2023-10-11 DIAGNOSIS — I2119 ST elevation (STEMI) myocardial infarction involving other coronary artery of inferior wall: Secondary | ICD-10-CM

## 2023-12-28 ENCOUNTER — Ambulatory Visit (INDEPENDENT_AMBULATORY_CARE_PROVIDER_SITE_OTHER): Payer: No Typology Code available for payment source | Admitting: Physician Assistant

## 2023-12-28 ENCOUNTER — Encounter: Payer: Self-pay | Admitting: Physician Assistant

## 2023-12-28 VITALS — BP 120/78 | HR 79 | Temp 97.1°F | Ht 74.0 in | Wt 345.2 lb

## 2023-12-28 DIAGNOSIS — Z7984 Long term (current) use of oral hypoglycemic drugs: Secondary | ICD-10-CM

## 2023-12-28 DIAGNOSIS — Z6841 Body Mass Index (BMI) 40.0 and over, adult: Secondary | ICD-10-CM

## 2023-12-28 DIAGNOSIS — E1165 Type 2 diabetes mellitus with hyperglycemia: Secondary | ICD-10-CM

## 2023-12-28 DIAGNOSIS — I10 Essential (primary) hypertension: Secondary | ICD-10-CM

## 2023-12-28 LAB — POCT GLYCOSYLATED HEMOGLOBIN (HGB A1C): Hemoglobin A1C: 6 % — AB (ref 4.0–5.6)

## 2023-12-28 LAB — MICROALBUMIN / CREATININE URINE RATIO
Creatinine,U: 80.8 mg/dL
Microalb Creat Ratio: UNDETERMINED mg/g (ref 0.0–30.0)
Microalb, Ur: <0.7 mg/dL

## 2023-12-28 MED ORDER — METFORMIN HCL 500 MG PO TABS: 1000.0000 mg | ORAL_TABLET | Freq: Two times a day (BID) | ORAL | 3 refills | Status: DC

## 2023-12-28 NOTE — Progress Notes (Signed)
 Patient ID: David Bender, male    DOB: [birth year], 48 y.o.   MRN: [id number]   Assessment & Plan:  Type 2 diabetes mellitus with hyperglycemia, without long-term current use of insulin (HCC) -     Microalbumin / creatinine urine ratio -     POCT glycosylated hemoglobin (Hb A1C)  Morbid obesity (HCC) -     metFORMIN  HCl; Take 2 tablets (1,000 mg total) by mouth 2 (two) times daily with a meal.  Dispense: 120 tablet; Refill: 3  Essential hypertension     Assessment & Plan  Assessment and Plan    Type 2 Diabetes Mellitus Significant improvement in glycemic control with A1c reduced from 8.3% to 6.0% over four months, achieved through a carnivore diet and metformin  1000 mg twice daily. Insurance does not cover GLP-1 receptor agonists like Mounjaro  or Ozempic. - Continue metformin  1000 mg twice daily. - Encourage continuation of dietary modifications - he will need to discuss with his cardiology team as well, but Plant-based vs animal-based diet would be preferred  - Perform urine microalbumin test to monitor kidney function.  Hypertension Blood pressure is well-controlled at 120/77. Reports low readings at home, such as 90/66, but denies dizziness or lightheadedness. Weight loss may necessitate medication adjustment in the future. - Continue current antihypertensive regimen. - Monitor blood pressure at home. - Adjust medication if symptomatic hypotension occurs.  Obesity Weight reduced from 389 lbs to 345 lbs, a 44 lbs loss, achieved through dietary changes. Reports decreased adherence to diet over the past month and a half but maintained weight loss. No significant increase in physical activity due to work schedule. - Encourage continued dietary modifications and weight loss. - Set goal to lose an additional 10-15 lbs in four months. - Reinforce simple dietary swaps, such as water instead of sweetened beverages.           Return in about 4 months (around 04/29/2024) for  recheck/follow-up.    Subjective:    Chief Complaint  Patient presents with   Diabetes    Pt in office for diabetes f/u w/ PCP; pt says all is well, no concerns or changes to discuss; pt admits not checking glucose daily but checking it some per patient.     Diabetes   Discussed the use of AI scribe software for clinical note transcription with the patient, who gave verbal consent to proceed.  History of Present Illness  Discussed the use of AI scribe software for clinical note transcription with the patient, who gave verbal consent to proceed.  History of Present Illness   David Bender is a 48 year old male with type 2 diabetes who presents for follow-up of his diabetes management.  He has experienced a significant improvement in his diabetes management, with his A1c dropping from 8.3% four months ago to 6.0% currently. This improvement is attributed to dietary changes, specifically adopting a carnivore diet since January 6th, which involves consuming primarily animal-based products. He has been taking metformin  1000 mg twice daily.  He mentions a weight loss of approximately 44 pounds, from 389 pounds to 345 pounds, over the past four months. His lowest weight was 336 pounds, but he has struggled to maintain consistent dietary habits over the past month and a half due to illness, which included symptoms of vomiting and diarrhea. Despite this, he has managed to avoid significant weight regain.  He has not increased his physical activity significantly, citing work commitments as a barrier. His work schedule  is irregular, impacting his ability to engage in regular exercise.  He continues to follow up with his cardiology team and has an appointment scheduled in two weeks. He monitors his blood pressure at home and has observed some low readings, such as 90/66 mmHg. No dizziness or lightheadedness despite low blood pressure readings.  In terms of his social history, he works weekends  and has a vacation planned for September. He drinks sugar-free energy drinks and avoids soda, opting for water or sugar-free flavored water instead.         Past Medical History:  Diagnosis Date   Acute ST elevation myocardial infarction (STEMI) of inferior wall (HCC) 05/26/2019   dRCA occluded--DES PCI (distal PDA occluded with distalization.  Codominant circumflex patent.  With staged PCI of 90% mLAD; normal EF on echo   Coronary artery disease involving native coronary artery of native heart with unstable angina pectoris (HCC) 05/26/2019   a. 05/26/2019 PCI: 100% dRCA - DES PCI (Synergy DES 2.5 x 28 - 2.65 mm); pLAD focal 90%.  Co-dominant LCx, normal; b. 05/27/2019 Staged PCI: mLAD 90% (2.75x12 Synergy DES - 2.61mm).   Hyperlipidemia associated with type 2 diabetes mellitus (HCC)    Kidney stone    2023   Morbid obesity with BMI of 45.0-49.9, adult (HCC)    Tobacco abuse - 2PPD 05/26/2019   Type II diabetes mellitus with complication (HCC)    Not currently taking medications    Past Surgical History:  Procedure Laterality Date   CORONARY STENT INTERVENTION N/A 05/27/2019   Procedure: CORONARY STENT INTERVENTION;  Surgeon: Arleen Lacer, MD;  Location: [facility] INVASIVE CV LAB;  Service: Cardiovascular:: Staged PCI of mid LAD-Synergy DES 2.7 mm at 12 mm (2.9 mm).    CORONARY STENT INTERVENTION N/A 05/26/2019   Procedure: CORONARY STENT INTERVENTION;  Surgeon: Arleen Lacer, MD;  Location: [facility] INVASIVE CV LAB;  Service: Cardiovascular:: distal RCA 100% (DES PCI Synergy 2.5 mm x 20 mm - 2.6 mm).   CORONARY/GRAFT ACUTE MI REVASCULARIZATION N/A 05/26/2019   Procedure: CORONARY/GRAFT ACUTE MI REVASCULARIZATION;  Surgeon: Arleen Lacer, MD;  Location: [facility] INVASIVE CV LAB;  Service: Cardiovascular:: PCI of dRCA   LEFT HEART CATH AND CORONARY ANGIOGRAPHY N/A 05/26/2019   Procedure: LEFT HEART CATH AND CORONARY ANGIOGRAPHY;  Surgeon: Arleen Lacer, MD;  Location: [facility] INVASIVE CV LAB;  Service:  Cardiovascular:: dRCA 100% (DES PCI) with dRPDA 100% from distal embolization, mLAD 90% (Staged DES PCI).    TRANSTHORACIC ECHOCARDIOGRAM  05/27/2019   EF 60 to 65%.  Unable to assess wall motion because of poor images.  GR 1 DD.  Both atria are normal size.  Normal valves.   WRIST SURGERY      Family History  Problem Relation Age of Onset   Diabetes Mother    Heart attack Mother    CAD Mother    Diabetes Father    Cancer Father    Diabetes Brother     Social History   Tobacco Use   Smoking status: Former    Current packs/day: 0.00    Average packs/day: 1.5 packs/day for 10.0 years (15.0 ttl pk-yrs)    Types: Cigarettes    Start date: 05/26/2009    Quit date: 05/27/2019    Years since quitting: 4.5   Smokeless tobacco: Never  Vaping Use   Vaping status: Never Used  Substance Use Topics   Alcohol use: No   Drug use: No     No Known Allergies  Review of Systems NEGATIVE UNLESS OTHERWISE INDICATED IN HPI      Objective:     BP 120/78 (BP Location: Left Arm, Patient Position: Sitting, Cuff Size: Large)   Pulse 79   Temp (!) 97.1 F (36.2 C) (Temporal)   Ht 6\' 2"  (1.88 m)   Wt (!) 345 lb 3.2 oz (156.6 kg)   SpO2 97%   BMI 44.32 kg/m   Wt Readings from Last 3 Encounters:  12/28/23 (!) 345 lb 3.2 oz (156.6 kg)  08/27/23 (!) 389 lb 3.2 oz (176.5 kg)  07/06/23 (!) 389 lb (176.4 kg)    BP Readings from Last 3 Encounters:  12/28/23 120/78  08/27/23 116/80  07/06/23 122/78     Physical Exam Vitals and nursing note reviewed.  Constitutional:      General: He is not in acute distress.    Appearance: Normal appearance. He is obese. He is not toxic-appearing.  HENT:     Head: Normocephalic and atraumatic.     Right Ear: External ear normal.  Eyes:     Extraocular Movements: Extraocular movements intact.     Conjunctiva/sclera: Conjunctivae normal.     Pupils: Pupils are equal, round, and reactive to light.  Cardiovascular:     Rate and Rhythm: Normal rate  and regular rhythm.     Pulses: Normal pulses.     Heart sounds: Normal heart sounds.  Pulmonary:     Effort: Pulmonary effort is normal.     Breath sounds: Normal breath sounds.  Musculoskeletal:     Cervical back: Normal range of motion and neck supple.     Right lower leg: No edema.     Left lower leg: No edema.  Skin:    General: Skin is warm and dry.     Findings: No lesion or rash.  Neurological:     General: No focal deficit present.     Mental Status: He is alert and oriented to person, place, and time.     Sensory: No sensory deficit.  Psychiatric:        Mood and Affect: Mood normal.        Behavior: Behavior normal.             Chantele Corado M Kilani Joffe, PA-C

## 2024-01-11 ENCOUNTER — Encounter: Payer: Self-pay | Admitting: Nurse Practitioner

## 2024-01-11 ENCOUNTER — Ambulatory Visit: Payer: PRIVATE HEALTH INSURANCE | Attending: Nurse Practitioner | Admitting: Nurse Practitioner

## 2024-01-11 VITALS — BP 122/84 | HR 80 | Ht 74.0 in | Wt 350.0 lb

## 2024-01-11 DIAGNOSIS — I10 Essential (primary) hypertension: Secondary | ICD-10-CM | POA: Diagnosis not present

## 2024-01-11 DIAGNOSIS — I2510 Atherosclerotic heart disease of native coronary artery without angina pectoris: Secondary | ICD-10-CM | POA: Diagnosis not present

## 2024-01-11 DIAGNOSIS — E1165 Type 2 diabetes mellitus with hyperglycemia: Secondary | ICD-10-CM

## 2024-01-11 DIAGNOSIS — E785 Hyperlipidemia, unspecified: Secondary | ICD-10-CM | POA: Diagnosis not present

## 2024-01-11 NOTE — Patient Instructions (Signed)
 Medication Instructions:  Your physician recommends that you continue on your current medications as directed. Please refer to the Current Medication list given to you today.  *If you need a refill on your cardiac medications before your next appointment, please call your pharmacy*  Lab Work: NONE ordered at this time of appointment   Testing/Procedures: NONE ordered at this time of appointment   Follow-Up: At [facility], you and your health needs are our priority.  As part of our continuing mission to provide you with exceptional heart care, our providers are all part of one team.  This team includes your primary Cardiologist (physician) and Advanced Practice Providers or APPs (Physician Assistants and Nurse Practitioners) who all work together to provide you with the care you need, when you need it.  Your next appointment:   1 year(s)  Provider:   Randene Bustard, MD    We recommend signing up for the patient portal called "MyChart".  Sign up information is provided on this After Visit Summary.  MyChart is used to connect with patients for Virtual Visits (Telemedicine).  Patients are able to view lab/test results, encounter notes, upcoming appointments, etc.  Non-urgent messages can be sent to your provider as well.   To learn more about what you can do with MyChart, go to [email or web address].

## 2024-01-11 NOTE — Progress Notes (Signed)
 Office Visit    Patient Name: David Bender Date of Encounter: 01/11/2024  Primary Care Provider:  Allwardt, Deleta Felix, PA-C Primary Cardiologist:  Randene Bustard, MD  Chief Complaint    48 year old male with a history of CAD s/p STEMI, DES-m-dRCA, DES-M LAD in 05/2019, hypertension, hyperlipidemia, type 2 diabetes, former tobacco use, and obesity who presents for follow-up related to CAD and hypertension.    Past Medical History    Past Medical History:  Diagnosis Date   Acute ST elevation myocardial infarction (STEMI) of inferior wall (HCC) 05/26/2019   dRCA occluded--DES PCI (distal PDA occluded with distalization.  Codominant circumflex patent.  With staged PCI of 90% mLAD; normal EF on echo   Coronary artery disease involving native coronary artery of native heart with unstable angina pectoris (HCC) 05/26/2019   a. 05/26/2019 PCI: 100% dRCA - DES PCI (Synergy DES 2.5 x 28 - 2.65 mm); pLAD focal 90%.  Co-dominant LCx, normal; b. 05/27/2019 Staged PCI: mLAD 90% (2.75x12 Synergy DES - 2.20mm).   Hyperlipidemia associated with type 2 diabetes mellitus (HCC)    Kidney stone    2023   Morbid obesity with BMI of 45.0-49.9, adult (HCC)    Tobacco abuse - 2PPD 05/26/2019   Type II diabetes mellitus with complication (HCC)    Not currently taking medications   Past Surgical History:  Procedure Laterality Date   CORONARY STENT INTERVENTION N/A 05/27/2019   Procedure: CORONARY STENT INTERVENTION;  Surgeon: Arleen Lacer, MD;  Location: [facility] INVASIVE CV LAB;  Service: Cardiovascular:: Staged PCI of mid LAD-Synergy DES 2.7 mm at 12 mm (2.9 mm).    CORONARY STENT INTERVENTION N/A 05/26/2019   Procedure: CORONARY STENT INTERVENTION;  Surgeon: Arleen Lacer, MD;  Location: [facility] INVASIVE CV LAB;  Service: Cardiovascular:: distal RCA 100% (DES PCI Synergy 2.5 mm x 20 mm - 2.6 mm).   CORONARY/GRAFT ACUTE MI REVASCULARIZATION N/A 05/26/2019   Procedure: CORONARY/GRAFT ACUTE MI REVASCULARIZATION;   Surgeon: Arleen Lacer, MD;  Location: [facility] INVASIVE CV LAB;  Service: Cardiovascular:: PCI of dRCA   LEFT HEART CATH AND CORONARY ANGIOGRAPHY N/A 05/26/2019   Procedure: LEFT HEART CATH AND CORONARY ANGIOGRAPHY;  Surgeon: Arleen Lacer, MD;  Location: [facility] INVASIVE CV LAB;  Service: Cardiovascular:: dRCA 100% (DES PCI) with dRPDA 100% from distal embolization, mLAD 90% (Staged DES PCI).    TRANSTHORACIC ECHOCARDIOGRAM  05/27/2019   EF 60 to 65%.  Unable to assess wall motion because of poor images.  GR 1 DD.  Both atria are normal size.  Normal valves.   WRIST SURGERY      Allergies  No Known Allergies   Labs/Other Studies Reviewed    The following studies were reviewed today:  Cardiac Studies & Procedures   ______________________________________________________________________________________________ CARDIAC CATHETERIZATION  CARDIAC CATHETERIZATION 05/27/2019  Conclusion  Mid LAD lesion is 90% stenosed.  Previously placed Mid RCA to Dist RCA drug eluting stent is widely patent.  RPDA lesion is 100% stenosed -stable from 05/26/2019  Post intervention, there is a 0% residual stenosis.  A drug-eluting stent was successfully placed using a STENT SYNERGY DES 2.75X12.  SUMMARY  Successful DES PCI of mid LAD with synergy DES 2.75 mm a 12 mm (2.9 mm)  Patent RCA stent from 05/26/2019, but persistent occlusion of the distal PDA (sidebranches are perfusing better)   RECOMMENDATIONS  Okay to transfer to telemetry/progressive care--anticipate discharge tomorrow  We will discontinue IV heparin .  Continue aggressive risk factor modification  Randene Bustard,  MD  Findings Coronary Findings Diagnostic  Dominance: Co-dominant  Left Anterior Descending Mid LAD lesion is 90% stenosed. The lesion is located at the major branch, focal and eccentric.  First Diagonal Branch Vessel is small in size.  First Septal Branch Vessel is small in size.  Second Diagonal Branch Vessel  is small in size.  Left Circumflex Vessel is large. Vessel is angiographically normal. Terminates as a major posterior lateral branch  First Obtuse Marginal Branch Vessel is moderate in size Vessel is angiographically normal.  Second Obtuse Marginal Branch Vessel is moderate in size. Vessel is angiographically normal.  Second Left Posterolateral Branch Vessel is angiographically normal.  Left Posterior Atrioventricular Artery Vessel is small in size. Vessel is angiographically normal.  Right Coronary Artery Previously placed Mid RCA to Dist RCA drug eluting stent is widely patent. Vessel is the culprit lesion. The lesion is segmental, irregular, thrombotic and ulcerative.  Acute Marginal Branch Vessel is small in size. The vessel is tortuous.  Right Ventricular Branch Vessel is small in size.  Right Posterior Descending Artery RPDA lesion is 100% stenosed. The lesion is thrombotic. Some of the lesion was treated with daughter technique using 1.5 mm balloon, but not able to get flow distally.  Intervention  Mid LAD lesion Stent Lesion length:  10 mm. CATH VISTA GUIDE 6FR XBLAD3.5 guide catheter was inserted. Lesion crossed with guidewire using a WIRE ASAHI PROWATER 180CM. Pre-stent angioplasty was performed using a BALLOON SAPPHIRE 2.5X12. Maximum pressure:  12 atm. Inflation time:  20 sec. A drug-eluting stent was successfully placed using a STENT SYNERGY DES 2.75X12. Maximum pressure: 14 atm. Inflation time: 20 sec. Minimum lumen area:  2.9 mm. Stent strut is well apposed. Post-stent angioplasty was not performed. After deployment, the stent appeared to be appropriately sized. Post-Intervention Lesion Assessment The intervention was successful. Pre-interventional TIMI flow is 3. Post-intervention TIMI flow is 3. Treated lesion length:  12 mm. No complications occurred at this lesion. There is a 0% residual stenosis post intervention.   CARDIAC CATHETERIZATION  05/26/2019  Conclusion  CULPRIT LESION: Mid RCA to Dist RCA lesion is 100% stenosed.  A drug-eluting stent was successfully placed using a STENT SYNERGY DES 2.5X28.--2.65 mm  Post intervention, there is a 0% residual stenosis.  RPDA lesion is 100% stenosed. Distalization  -------------------------------------------  Mid LAD lesion is 90% stenosed.  -------------------------------------------  The left ventricular systolic function is normal. The left ventricular ejection fraction is 50-55% by visual estimate. LV end diastolic pressure is moderately elevated.  SUMMARY  Codominant system with Left Posterolateral branches and right PDA  Severe two-vessel CAD: 100% Mid-distalRCA occluded (successful DES PCI with Synergy DES 2.5 mm x 28 mm--2.65 mm) and residual distal embolization to the PDA occluding the distal quarter of the PDA, 90% mid focal LAD lesion at D2  Preserved EF with distal inferoapical hypokinesis and moderate to severely elevated EDP of 20 to 23 mmHg (Acute Diastolic Heart Failure).   RECOMMENDATIONS  Admit to CCU for ongoing care and TR band removal.  With significant LAD lesion, will restart aspirin  8 hours after sheath removal and plan for staged LAD PCI for 05/27/2019  Check fasting lipid panel and A1c  Initiate high-dose/high intensity statin (atorvastatin  80 mg)  Start low-dose beta-blocker  Consider echocardiogram prior to discharge  Smoking cessation counseling   Randene Bustard, M.D., M.S. Interventional Cardiologist  Findings Coronary Findings Diagnostic  Dominance: Co-dominant  Left Anterior Descending Mid LAD lesion is 90% stenosed. The lesion is located at the  major branch, focal and eccentric.  First Diagonal Branch Vessel is small in size.  First Septal Branch Vessel is small in size.  Second Diagonal Branch Vessel is small in size.  Left Circumflex Vessel is large. Vessel is angiographically normal. Terminates as a major  posterior lateral branch  First Obtuse Marginal Branch Vessel is moderate in size Vessel is angiographically normal.  Second Obtuse Marginal Branch Vessel is moderate in size. Vessel is angiographically normal.  Second Left Posterolateral Branch Vessel is angiographically normal.  Left Posterior Atrioventricular Artery Vessel is small in size. Vessel is angiographically normal.  Right Coronary Artery Mid RCA to Dist RCA lesion is 100% stenosed. Vessel is the culprit lesion. The lesion is segmental, irregular, thrombotic and ulcerative.  Acute Marginal Branch Vessel is small in size. The vessel is tortuous.  Right Ventricular Branch Vessel is small in size.  Right Posterior Descending Artery RPDA lesion is 100% stenosed. The lesion is thrombotic. Some of the lesion was treated with daughter technique using 1.5 mm balloon, but not able to get flow distally.  Intervention  Mid RCA to Dist RCA lesion Stent Lesion length:  24 mm. CATHETER LAUNCHER 6FR JR4 guide catheter was inserted. Lesion crossed with guidewire using a WIRE ASAHI PROWATER 180CM. Pre-stent angioplasty was performed using a BALLOON SAPPHIRE 2.5X12. Maximum pressure:  10 atm. Inflation time:  20 sec. A drug-eluting stent was successfully placed using a STENT SYNERGY DES 2.5X28. Maximum pressure: 14 atm. Inflation time: 20 sec. Minimum lumen area:  2.6 mm. Stent strut is well apposed. Post-stent angioplasty was not performed.  temporary lost wire Stent Post-Intervention Lesion Assessment The intervention was successful. Pre-interventional TIMI flow is 0. Post-intervention TIMI flow is 3. Treated lesion length:  28 mm. At this lesion, a distal embolization occurred. There is a 0% residual stenosis post intervention.     ECHOCARDIOGRAM  ECHOCARDIOGRAM COMPLETE 05/27/2019  Narrative ECHOCARDIOGRAM REPORT    Patient Name:   David Bender Date of Exam: 05/27/2019 Medical Rec #:  [id number]     Height:       74.0  in Accession #:    [id number]    Weight:       330.2 lb Date of Birth:  Nov 06, 1975      BSA:          2.69 m Patient Age:    43 years      BP:           129/88 mmHg Patient Gender: M             HR:           60 bpm. Exam Location:  Inpatient  Procedure: 2D Echo  Indications:    786.50 chest pain  History:        Patient has no prior history of Echocardiogram examinations. Tobacco abuse. CAD. STEMI.  Sonographer:    Belton Boy RDCS (AE) Referring Phys: [id number] Casimer Clear   Sonographer Comments: Patient is morbidly obese. Image acquisition challenging due to patient body habitus. IMPRESSIONS   1. Left ventricular ejection fraction, by visual estimation, is 60 to 65%. The left ventricle has normal function. There is no left ventricular hypertrophy. Inadequate for regional WMA's. 2. Left ventricular diastolic Doppler parameters are consistent with impaired relaxation pattern of LV diastolic filling. 3. Global right ventricle has normal systolic function.The right ventricular size is normal. No increase in right ventricular wall thickness. 4. Left atrial size was normal. 5. Right atrial size was normal.  6. The mitral valve is grossly normal. Trace mitral valve regurgitation. 7. The tricuspid valve is grossly normal. Tricuspid valve regurgitation was not visualized by color flow Doppler. 8. The aortic valve is tricuspid Aortic valve regurgitation was not visualized by color flow Doppler. 9. The pulmonic valve was grossly normal. Pulmonic valve regurgitation is not visualized by color flow Doppler.  FINDINGS Left Ventricle: Left ventricular ejection fraction, by visual estimation, is 60 to 65%. The left ventricle has normal function. There is no left ventricular hypertrophy. Spectral Doppler shows Left ventricular diastolic Doppler parameters are consistent with impaired relaxation pattern of LV diastolic filling. Indeterminate filling pressures.  Right Ventricle: The right  ventricular size is normal. No increase in right ventricular wall thickness. Global RV systolic function is has normal systolic function.  Left Atrium: Left atrial size was normal in size.  Right Atrium: Right atrial size was normal in size  Pericardium: There is no evidence of pericardial effusion.  Mitral Valve: The mitral valve is grossly normal. Trace mitral valve regurgitation.  Tricuspid Valve: The tricuspid valve is grossly normal. Tricuspid valve regurgitation was not visualized by color flow Doppler.  Aortic Valve: The aortic valve is tricuspid. Aortic valve regurgitation was not visualized by color flow Doppler.  Pulmonic Valve: The pulmonic valve was grossly normal. Pulmonic valve regurgitation is not visualized by color flow Doppler.  Aorta: The aortic root and ascending aorta are structurally normal, with no evidence of dilitation.  Venous: The inferior vena cava was not well visualized.  IAS/Shunts: No atrial level shunt detected by color flow Doppler.    LEFT VENTRICLE PLAX 2D LVIDd:         5.10 cm  Diastology LVIDs:         3.40 cm  LV e' lateral:   11.90 cm/s LV PW:         1.20 cm  LV E/e' lateral: 5.4 LV IVS:        1.00 cm  LV e' medial:    7.40 cm/s LVOT diam:     2.30 cm  LV E/e' medial:  8.7 LV SV:         76 ml LV SV Index:   26.70 LVOT Area:     4.15 cm   RIGHT VENTRICLE RV Basal diam:  5.60 cm RV S prime:     13.70 cm/s TAPSE (M-mode): 2.1 cm  LEFT ATRIUM             Index       RIGHT ATRIUM           Index LA diam:        4.40 cm 1.64 cm/m  RA Area:     21.40 cm LA Vol (A2C):   63.7 ml 23.68 ml/m RA Volume:   66.30 ml  24.65 ml/m LA Vol (A4C):   55.8 ml 20.75 ml/m LA Biplane Vol: 62.6 ml 23.27 ml/m AORTIC VALVE LVOT Vmax:   76.70 cm/s LVOT Vmean:  56.600 cm/s LVOT VTI:    0.152 m  AORTA Ao Root diam: 3.50 cm  MITRAL VALVE MV Area (PHT): 2.48 cm             SHUNTS MV PHT:        88.74 msec           Systemic VTI:  0.15 m MV  Decel Time: 306 msec             Systemic Diam: 2.30 cm MV E velocity: 64.50  cm/s 103 cm/s MV A velocity: 68.40 cm/s 70.3 cm/s MV E/A ratio:  0.94       1.5   Dinah Franco MD Electronically signed by Dinah Franco MD Signature Date/Time: 05/27/2019/3:23:08 PM    Final          ______________________________________________________________________________________________     Recent Labs: 08/27/2023: ALT 47; BUN 15; Creatinine, Ser 0.72; Potassium 4.2; Sodium 140  Recent Lipid Panel    Component Value Date/Time   CHOL 159 11/25/2022 0909   TRIG 467 (H) 11/25/2022 0909   HDL 40 11/25/2022 0909   CHOLHDL 4.0 11/25/2022 0909   CHOLHDL 5.9 05/27/2019 0220   VLDL 69 (H) 05/27/2019 0220   LDLCALC 50 11/25/2022 0909   LDLDIRECT 106.6 (H) 05/26/2019 0653    History of Present Illness    48 year old male with the above past medical history including CAD s/p STEMI, DES-m-dRCA, DES-M LAD in 05/2019, hypertension, hyperlipidemia, type 2 diabetes, former tobacco use, and obesity.   He was hospitalized in 06/14/2019 in the setting of inferior STEMI.  Cardiac catheterization revealed 100% occluded mid to distal RCA s/p DES, RPDA 100% stenosis, mid LAD lesion 90% stenosis for which he underwent staged PCI/DES.  Echocardiogram at the time revealed EF 60 to 65%, no RWMA, normal RV systolic function, no significant valvular abnormalities.  He was last seen in the office on 07/06/2023 and was stable from a cardiac standpoint.  He denied symptoms concerning for angina. BP was well controlled.   He presents today for follow-up.  Since his last visit he has done well from a cardiac standpoint.  He denies any symptoms concerning for angina.  He did have some lower BP readings in the setting of significant weight loss.  He was asymptomatic.  He was participating in the carnivore diet.  He lost over 40 pounds.  He has since resumed a regular diet, BP has stabilized.  Overall, he reports feeling  well.  Home Medications    Current Outpatient Medications  Medication Sig Dispense Refill   atorvastatin  (LIPITOR ) 80 MG tablet TAKE 1 TABLET BY MOUTH David 90 tablet 2   carvedilol  (COREG ) 12.5 MG tablet TAKE 1 TABLET BY MOUTH TWICE A DAY 180 tablet 2   chlorthalidone  (HYGROTON ) 25 MG tablet Take 0.5 tablets (12.5 mg total) by mouth David. 45 tablet 3   clopidogrel  (PLAVIX ) 75 MG tablet TAKE 1 TABLET BY MOUTH David 90 tablet 3   metFORMIN  (GLUCOPHAGE ) 500 MG tablet Take 2 tablets (1,000 mg total) by mouth 2 (two) times David with a meal. 120 tablet 3   nitroGLYCERIN  (NITROSTAT ) 0.4 MG SL tablet Place 1 tablet (0.4 mg total) under the tongue every 5 (five) minutes as needed for chest pain (CP or SOB). 25 tablet 3   olmesartan  (BENICAR ) 40 MG tablet TAKE 1 TABLET BY MOUTH David 90 tablet 3   amLODipine  (NORVASC ) 5 MG tablet Take 1 tablet (5 mg total) by mouth David. 90 tablet 3   No current facility-administered medications for this visit.     Review of Systems    He denies chest pain, palpitations, dyspnea, pnd, orthopnea, n, v, dizziness, syncope, edema, weight gain, or early satiety. All other systems reviewed and are otherwise negative except as noted above.   Physical Exam    VS:  BP 122/84 (BP Location: Left Arm, Patient Position: Sitting, Cuff Size: Normal)   Pulse 80   Ht 6\' 2"  (1.88 m)   Wt (!) 350 lb (158.8 kg)   SpO2  97%   BMI 44.94 kg/m   GEN: Well nourished, well developed, in no acute distress. HEENT: normal. Neck: Supple, no JVD, carotid bruits, or masses. Cardiac: RRR, no murmurs, rubs, or gallops. No clubbing, cyanosis, edema.  Radials/DP/PT 2+ and equal bilaterally.  Respiratory:  Respirations regular and unlabored, clear to auscultation bilaterally. GI: Soft, nontender, nondistended, BS + x 4. MS: no deformity or atrophy. Skin: warm and dry, no rash. Neuro:  Strength and sensation are intact. Psych: Normal affect.  Accessory Clinical Findings    ECG  personally reviewed by me today - EKG Interpretation Date/Time:  Monday Jan 11 2024 08:08:12 EDT Ventricular Rate:  80 PR Interval:  204 QRS Duration:  112 QT Interval:  376 QTC Calculation: 433 R Axis:   -34  Text Interpretation: Normal sinus rhythm Left axis deviation Inferior infarct (cited on or before 26-May-2019) Cannot rule out Anterior infarct (cited on or before 26-May-2019) When compared with ECG of 31-Dec-2021 00:10, Questionable change in initial forces of Lateral leads Questionable change in initial forces of Inferior leads Confirmed by Marlana Silvan ([id number]) on 01/11/2024 8:25:14 AM  - no acute changes.   Lab Results  Component Value Date   WBC 12.3 (H) 12/31/2021   HGB 14.8 12/31/2021   HCT 42.6 12/31/2021   MCV 88.2 12/31/2021   PLT 269 12/31/2021   Lab Results  Component Value Date   CREATININE 0.72 08/27/2023   BUN 15 08/27/2023   NA 140 08/27/2023   K 4.2 08/27/2023   CL 97 08/27/2023   CO2 33 (H) 08/27/2023   Lab Results  Component Value Date   ALT 47 08/27/2023   AST 25 08/27/2023   ALKPHOS 46 08/27/2023   BILITOT 0.4 08/27/2023   Lab Results  Component Value Date   CHOL 159 11/25/2022   HDL 40 11/25/2022   LDLCALC 50 11/25/2022   LDLDIRECT 106.6 (H) 05/26/2019   TRIG 467 (H) 11/25/2022   CHOLHDL 4.0 11/25/2022    Lab Results  Component Value Date   HGBA1C 6.0 (A) 12/28/2023    Assessment & Plan    1. Hypertension: BP well controlled. Continue current antihypertensive regimen.   2. CAD: S/p STEMI, DES-m-dRCA, DES-M LAD in 05/2019. Stable with no anginal symptoms. No indication for ischemic evaluation.  Continue Plavix , carvedilol , Olmesartan , amlodipine , chlorthalidone , and Lipitor .   3. Hyperlipidemia: LDL was 50 in 11/2022.  Will repeat fasting lipid panel, CMET.  Continue Lipitor .   4. Type 2 diabetes/obesity: A1c was 6.0 in 12/2023.  He lost over 40 pounds with the carnivore diet.  A1c decreased from 8.3-6.0.  He has since resumed a regular  diet.  He previously took Mounjaro  in the past with good success, but his insurance stopped covering it.  Hopefully he can revisit this at some point in the future.  Encouraged ongoing lifestyle modifications with diet and exercise.     5. Disposition: Follow-up in 1 year.      Jude Norton, NP 01/11/2024, 8:33 AM

## 2024-02-15 ENCOUNTER — Other Ambulatory Visit: Payer: Self-pay | Admitting: Nurse Practitioner

## 2024-04-18 ENCOUNTER — Ambulatory Visit (INDEPENDENT_AMBULATORY_CARE_PROVIDER_SITE_OTHER): Payer: PRIVATE HEALTH INSURANCE | Admitting: Physician Assistant

## 2024-04-18 VITALS — BP 124/78 | HR 82 | Temp 97.2°F | Ht 74.0 in | Wt 364.4 lb

## 2024-04-18 DIAGNOSIS — Z23 Encounter for immunization: Secondary | ICD-10-CM | POA: Diagnosis not present

## 2024-04-18 DIAGNOSIS — E1165 Type 2 diabetes mellitus with hyperglycemia: Secondary | ICD-10-CM | POA: Diagnosis not present

## 2024-04-18 DIAGNOSIS — Z7984 Long term (current) use of oral hypoglycemic drugs: Secondary | ICD-10-CM | POA: Diagnosis not present

## 2024-04-18 MED ORDER — METFORMIN HCL 500 MG PO TABS: 1000.0000 mg | ORAL_TABLET | Freq: Two times a day (BID) | ORAL | 3 refills | Status: DC

## 2024-04-18 NOTE — Progress Notes (Signed)
 Patient ID: David Bender, male    DOB: 11-12-1975, 48 y.o.   MRN: [id number]   Assessment & Plan:  Type 2 diabetes mellitus with hyperglycemia, without long-term current use of insulin (HCC)  Morbid obesity (HCC) -     metFORMIN  HCl; Take 2 tablets (1,000 mg total) by mouth 2 (two) times daily with a meal.  Dispense: 120 tablet; Refill: 3  Need for pneumococcal vaccine -     Pneumococcal conjugate vaccine 20-valent     Assessment & Plan Type 2 diabetes mellitus Type 2 diabetes mellitus is well-controlled on metformin  with a recent A1c of 6.0. No signs of neuropathy or proteinuria. Sedentary lifestyle due to work. - Continue metformin  1000 mg twice daily - Perform A1c test at next visit - Encourage dietary modifications, including reducing soda intake and increasing vegetable consumption - Encourage regular exercise - Monitor for signs of neuropathy, such as tingling or burning sensations in extremities - Administer Prevnar 20 vaccine today Lab Results  Component Value Date   HGBA1C 6.0 (A) 12/28/2023   HGBA1C 8.3 (H) 08/27/2023   HGBA1C 6.8 (A) 02/25/2023     Morbid obesity (BMI 46.7) Morbid obesity with a BMI of 46.7. 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 - Encourage regular physical activity - Advise dietary modifications to reduce caloric intake and increase plant-based foods      Return in about 6 months (around 10/19/2024) for recheck/follow-up.    Subjective:    Chief Complaint  Patient presents with   Diabetes    Pt in office for diabetes follow up;     Diabetes   Discussed the use of AI scribe software for clinical note transcription with the patient, who gave verbal consent to proceed.  History of Present Illness David Bender is a 48 year old male with type 2 diabetes who presents for a routine follow-up and pneumonia vaccination.  He has type 2 diabetes, well-managed with metformin  2000 mg daily. His last hemoglobin A1c  was 6. No symptoms of neuropathy such as tingling or burning in his extremities. Urinalysis shows no proteinuria.  He has a BMI of 46.7 and works for a [employer], leading to a sedentary lifestyle due to extensive driving.  He has not had recent ophthalmologic evaluation for diabetic retinopathy.     Past Medical History:  Diagnosis Date   Acute ST elevation myocardial infarction (STEMI) of inferior wall (HCC) 05/26/2019   dRCA occluded--DES PCI (distal PDA occluded with distalization.  Codominant circumflex patent.  With staged PCI of 90% mLAD; normal EF on echo   Coronary artery disease involving native coronary artery of native heart with unstable angina pectoris (HCC) 05/26/2019   a. 05/26/2019 PCI: 100% dRCA - DES PCI (Synergy DES 2.5 x 28 - 2.65 mm); pLAD focal 90%.  Co-dominant LCx, normal; b. 05/27/2019 Staged PCI: mLAD 90% (2.75x12 Synergy DES - 2.86mm).   Hyperlipidemia associated with type 2 diabetes mellitus (HCC)    Kidney stone    2023   Morbid obesity with BMI of 45.0-49.9, adult (HCC)    Tobacco abuse - 2PPD 05/26/2019   Type II diabetes mellitus with complication (HCC)    Not currently taking medications    Past Surgical History:  Procedure Laterality Date   CORONARY STENT INTERVENTION N/A 05/27/2019   Procedure: CORONARY STENT INTERVENTION;  Surgeon: Anner Alm ORN, MD;  Location: [facility] INVASIVE CV LAB;  Service: Cardiovascular:: Staged PCI of mid LAD-Synergy DES 2.7 mm at  12 mm (2.9 mm).    CORONARY STENT INTERVENTION N/A 05/26/2019   Procedure: CORONARY STENT INTERVENTION;  Surgeon: Anner Alm ORN, MD;  Location: [facility] INVASIVE CV LAB;  Service: Cardiovascular:: distal RCA 100% (DES PCI Synergy 2.5 mm x 20 mm - 2.6 mm).   CORONARY/GRAFT ACUTE MI REVASCULARIZATION N/A 05/26/2019   Procedure: CORONARY/GRAFT ACUTE MI REVASCULARIZATION;  Surgeon: Anner Alm ORN, MD;  Location: [facility] INVASIVE CV LAB;  Service: Cardiovascular:: PCI of dRCA   LEFT HEART CATH AND CORONARY  ANGIOGRAPHY N/A 05/26/2019   Procedure: LEFT HEART CATH AND CORONARY ANGIOGRAPHY;  Surgeon: Anner Alm ORN, MD;  Location: [facility] INVASIVE CV LAB;  Service: Cardiovascular:: dRCA 100% (DES PCI) with dRPDA 100% from distal embolization, mLAD 90% (Staged DES PCI).    TRANSTHORACIC ECHOCARDIOGRAM  05/27/2019   EF 60 to 65%.  Unable to assess wall motion because of poor images.  GR 1 DD.  Both atria are normal size.  Normal valves.   WRIST SURGERY      Family History  Problem Relation Age of Onset   Diabetes Mother    Heart attack Mother    CAD Mother    Diabetes Father    Cancer Father    Diabetes Brother     Social History   Tobacco Use   Smoking status: Former    Current packs/day: 0.00    Average packs/day: 1.5 packs/day for 10.0 years (15.0 ttl pk-yrs)    Types: Cigarettes    Start date: 05/26/2009    Quit date: 05/27/2019    Years since quitting: 4.8   Smokeless tobacco: Never  Vaping Use   Vaping status: Never Used  Substance Use Topics   Alcohol use: No   Drug use: No     No Known Allergies  Review of Systems NEGATIVE UNLESS OTHERWISE INDICATED IN HPI      Objective:     BP 124/78 (BP Location: Left Arm, Patient Position: Sitting, Cuff Size: Large)   Pulse 82   Temp (!) 97.2 F (36.2 C) (Temporal)   Ht 6' 2 (1.88 m)   Wt (!) 364 lb 6.4 oz (165.3 kg)   SpO2 99%   BMI 46.79 kg/m   Wt Readings from Last 3 Encounters:  04/18/24 (!) 364 lb 6.4 oz (165.3 kg)  01/11/24 (!) 350 lb (158.8 kg)  12/28/23 (!) 345 lb 3.2 oz (156.6 kg)    BP Readings from Last 3 Encounters:  04/18/24 124/78  01/11/24 122/84  12/28/23 120/78     Physical Exam Vitals and nursing note reviewed.  Constitutional:      General: He is not in acute distress.    Appearance: Normal appearance. He is obese. He is not toxic-appearing.  HENT:     Head: Normocephalic and atraumatic.     Right Ear: External ear normal.  Eyes:     Extraocular Movements: Extraocular movements intact.      Conjunctiva/sclera: Conjunctivae normal.     Pupils: Pupils are equal, round, and reactive to light.  Cardiovascular:     Rate and Rhythm: Normal rate and regular rhythm.     Pulses: Normal pulses.     Heart sounds: Normal heart sounds.  Pulmonary:     Effort: Pulmonary effort is normal.     Breath sounds: Normal breath sounds.  Musculoskeletal:     Cervical back: Normal range of motion and neck supple.     Right lower leg: No edema.     Left lower leg: No edema.  Skin:    General: Skin is warm and dry.     Findings: No lesion or rash.  Neurological:     General: No focal deficit present.     Mental Status: He is alert and oriented to person, place, and time.     Sensory: No sensory deficit.  Psychiatric:        Mood and Affect: Mood normal.        Behavior: Behavior normal.             Arjan Strohm M Kennth Vanbenschoten, PA-C

## 2024-05-09 ENCOUNTER — Other Ambulatory Visit: Payer: Self-pay | Admitting: Nurse Practitioner

## 2024-05-09 MED ORDER — ATORVASTATIN CALCIUM 80 MG PO TABS: ORAL_TABLET | ORAL | 1 refills | Status: AC

## 2024-05-16 ENCOUNTER — Other Ambulatory Visit: Payer: Self-pay

## 2024-05-16 DIAGNOSIS — I2119 ST elevation (STEMI) myocardial infarction involving other coronary artery of inferior wall: Secondary | ICD-10-CM

## 2024-05-16 DIAGNOSIS — I2510 Atherosclerotic heart disease of native coronary artery without angina pectoris: Secondary | ICD-10-CM

## 2024-05-16 MED ORDER — CARVEDILOL 12.5 MG PO TABS: 12.5000 mg | ORAL_TABLET | Freq: Two times a day (BID) | ORAL | 0 refills | Status: DC

## 2024-08-05 ENCOUNTER — Other Ambulatory Visit: Payer: Self-pay

## 2024-08-08 MED ORDER — CLOPIDOGREL BISULFATE 75 MG PO TABS: 75.0000 mg | ORAL_TABLET | Freq: Every day | ORAL | 1 refills | Status: AC

## 2024-08-13 ENCOUNTER — Other Ambulatory Visit: Payer: Self-pay | Admitting: Nurse Practitioner

## 2024-08-15 ENCOUNTER — Other Ambulatory Visit: Payer: Self-pay | Admitting: Nurse Practitioner

## 2024-08-15 DIAGNOSIS — I2119 ST elevation (STEMI) myocardial infarction involving other coronary artery of inferior wall: Secondary | ICD-10-CM

## 2024-08-15 DIAGNOSIS — I2510 Atherosclerotic heart disease of native coronary artery without angina pectoris: Secondary | ICD-10-CM

## 2024-09-03 ENCOUNTER — Other Ambulatory Visit: Payer: Self-pay | Admitting: Physician Assistant

## 2024-10-19 ENCOUNTER — Ambulatory Visit: Admitting: Physician Assistant
